# Patient Record
Sex: Male | Born: 1960 | Race: White | Hispanic: No | Marital: Married | Smoking: Never smoker
Health system: Southern US, Community
[De-identification: ages and names within clinical notes are randomized; demographics above are authoritative.]

## PROBLEM LIST (undated history)

## (undated) DIAGNOSIS — H9313 Tinnitus, bilateral: Secondary | ICD-10-CM

## (undated) DIAGNOSIS — F419 Anxiety disorder, unspecified: Secondary | ICD-10-CM

## (undated) DIAGNOSIS — M6530 Trigger finger, unspecified finger: Secondary | ICD-10-CM

## (undated) DIAGNOSIS — K219 Gastro-esophageal reflux disease without esophagitis: Secondary | ICD-10-CM

## (undated) HISTORY — DX: Trigger finger, unspecified finger: M65.30

## (undated) HISTORY — DX: Anxiety disorder, unspecified: F41.9

## (undated) HISTORY — DX: Tinnitus, bilateral: H93.13

## (undated) HISTORY — DX: Gastro-esophageal reflux disease without esophagitis: K21.9

---

## 1996-07-09 DIAGNOSIS — M5030 Other cervical disc degeneration, unspecified cervical region: Secondary | ICD-10-CM | POA: Insufficient documentation

## 1999-06-01 ENCOUNTER — Emergency Department (HOSPITAL_COMMUNITY): Admission: EM | Admit: 1999-06-01 | Discharge: 1999-06-01 | Payer: Self-pay

## 1999-06-21 ENCOUNTER — Other Ambulatory Visit: Admission: RE | Admit: 1999-06-21 | Discharge: 1999-06-28 | Payer: Self-pay

## 2003-02-11 ENCOUNTER — Ambulatory Visit (HOSPITAL_COMMUNITY): Admission: RE | Admit: 2003-02-11 | Discharge: 2003-02-11 | Payer: Self-pay | Admitting: Urology

## 2003-02-11 ENCOUNTER — Ambulatory Visit (HOSPITAL_BASED_OUTPATIENT_CLINIC_OR_DEPARTMENT_OTHER): Admission: RE | Admit: 2003-02-11 | Discharge: 2003-02-11 | Payer: Self-pay | Admitting: Urology

## 2003-02-11 ENCOUNTER — Encounter (INDEPENDENT_AMBULATORY_CARE_PROVIDER_SITE_OTHER): Payer: Self-pay | Admitting: Specialist

## 2005-01-18 ENCOUNTER — Ambulatory Visit (HOSPITAL_COMMUNITY): Admission: RE | Admit: 2005-01-18 | Discharge: 2005-01-18 | Payer: Self-pay | Admitting: Internal Medicine

## 2005-02-12 ENCOUNTER — Encounter: Admission: RE | Admit: 2005-02-12 | Discharge: 2005-03-14 | Payer: Self-pay | Admitting: Neurology

## 2006-10-03 ENCOUNTER — Ambulatory Visit: Payer: Self-pay | Admitting: Cardiology

## 2006-10-03 ENCOUNTER — Inpatient Hospital Stay (HOSPITAL_COMMUNITY): Admission: EM | Admit: 2006-10-03 | Discharge: 2006-10-06 | Payer: Self-pay | Admitting: Emergency Medicine

## 2006-10-06 ENCOUNTER — Encounter (INDEPENDENT_AMBULATORY_CARE_PROVIDER_SITE_OTHER): Payer: Self-pay | Admitting: Internal Medicine

## 2006-11-28 ENCOUNTER — Ambulatory Visit: Payer: Self-pay | Admitting: Cardiology

## 2010-08-14 NOTE — Assessment & Plan Note (Signed)
[facility]                            CARDIOLOGY OFFICE NOTE   NAME:Tim Manning, Tim Manning                   MRN:          [id number]  DATE:11/28/2006                            DOB:          21-Jul-1960    PRIMARY CARE PHYSICIAN:  Jonita Albee, M.D.   REASON FOR PRESENTATION:  Patient with chest pain.   HISTORY OF PRESENT ILLNESS:  The patient is a pleasant 50 year old  gentleman without prior cardiac history.  He was hospitalized with  atypical chest pain on July 3.  There were some worsened features.  We  did perform a cardiac CT on him that demonstrated no coronary calcium  and no evidence of coronary obstruction.  He had a mildly reduced  ejection fraction, but an echocardiogram follow-up suggested the EF to  be normal.  He had had a history of mitral valve prolapse questionably  in the past, but this was not seen on the echocardiogram.  There was  tiny pulmonary nodule.  However, the radiology reading suggests this to  be benign with his history of not smoking.   Since being seen, the patient has had one episode of chest discomfort.  He has not been having any neck or arm discomfort.  He has not been  having any neck or arm discomfort.  He has not been having any  palpitations or presyncope or syncope.  He denies any PND or orthopnea.   PAST MEDICAL HISTORY:  1. General chest pain.  2. Dyslipidemia with a low HDL.  3. Cervical bulging disks following an accident.  4. Tonsillectomy and adenoidectomy.   ALLERGIES:  CODEINE.   MEDICATIONS:  None.   REVIEW OF SYSTEMS:  As stated in the HPI, otherwise negative for other  systems.   PHYSICAL EXAMINATION:  The patient is in no distress.  Blood pressure 117/79, heart rate 82 and regular.  HEENT:  Eyelids unremarkable.  Pupils equal, round, and react to light.  Fundi not visualized.  Oral mucosa unremarkable.  NECK:  No jugular venous distention.  Wave form within normal limits.  Carotid  upstroke brisk and symmetrical.  No bruits, thyromegaly.  LYMPHATICS:  No cervical, axillary, inguinal adenopathy.  LUNGS:  Clear to auscultation bilaterally.  BACK:  No costovertebral angle tenderness.  CHEST:  Unremarkable.  HEART:  PMI not displaced or sustained.  S1/s2 within normal limits.  No  S3, no S4, no clicks, no rubs, no murmurs.  ABDOMEN:  Flat.  Positive bowel sounds.  Normal in frequency, pitch, no  bruits, no rebound, no guarding, no midline pulsatile mass, no  organomegaly.  SKIN:  No rashes, no nodules.  EXTREMITIES:  2+ pulses, no edema.   EKG:  Sinus rhythm.  Rate 82, RSR prime V1 and V2.  No acute ST/T-wave  changes.   ASSESSMENT/PLAN:  1. Chest discomfort.  The patient's chest discomfort does not have an      anginal etiology.  No further cardiovascular testing is suggested.      We have discussed primary risk reduction.  2. Dyslipidemia.  I have suggested walking to try to increase his HDL.  3. Small pulmonary nodule.  The patient had this incidentally noted on      the CT.  I have discussed it with the patient.  I have suggested a      follow-up chest x-ray per his primary caregiver in one year.  He      will follow up with this.     Rollene Rotunda, MD, [facility]  Electronically Signed    JH/MedQ  DD: 11/28/2006  DT: 11/29/2006  Job #: [id number]   cc:   Jonita Albee, M.D.

## 2010-08-14 NOTE — Letter (Signed)
November 28, 2006    Jonita Albee, M.D.  [facility]  [address]  [place], [place] [address]   RE:  Manning, Tim  MRN:  [id number]  /  DOB:  11/11/1960   Dear Thayer Ohm,   Recently I had the pleasure of meeting Mr. Welty in the hospital.  Chest discomfort.  He was admitted initially by the hospitalist, who had  a higher index of suspicion that he might have coronary disease;  however, I thought his symptoms were somewhat more atypical.  We  eventually sent him for a cardiac CT, which demonstrated no coronary  disease and a negative calcium score.  There was a small subpleural  nodule on the posterior right lung apex.  This is mentioned in the CT  report, as they often do.  It is not suggested what the followup of this  should be.  Dr. Fredirick Lathe did comment that the patient was a nonsmoker.  However, she did not exclude the possibility that he needed a follow-up  CT in one year.  I have mentioned this to the patient.   I will not be seeing him back in this clinic, as he does not need  further cardiovascular followup.  I wonder if I can turn this matter  over to you, as you are his primary care physician.  Thank you for your  attention to this matter.  If you would like me to handle this in any  different way, please do not hesitate to give me a call at [phone], my  cell phone.  As always, thank you for allowing [place] to participate in the  care of your patients.    Sincerely,      Rollene Rotunda, MD, [facility]  Electronically Signed    JH/MedQ  DD: 11/28/2006  DT: 11/30/2006  Job #: [id number]

## 2010-08-14 NOTE — Discharge Summary (Signed)
NAME:  Tim Manning, Tim Manning            ACCOUNT NO.:  [id number]   MEDICAL RECORD NO.:  [id number]          PATIENT TYPE:  INP   LOCATION:  3733                         FACILITY:  [facility]   PHYSICIAN:  Altha Harm, MDDATE OF BIRTH:  May 11, 1960   DATE OF ADMISSION:  10/02/2006  DATE OF DISCHARGE:  10/06/2006                               DISCHARGE SUMMARY   DISCHARGE DISPOSITION:  Home.   FINAL DISCHARGE DIAGNOSES:  1. Chest pain, noncardiac.  2. History of ruptured disc at C5 and C6.  3. Chronic back pain.   DISCHARGE MEDICATIONS:  Naproxen 2-3 pills q.4-6 hours p.r.n.   CONSULTANTS:  Dr. Antoine Poche, cardiology.   PROCEDURES:  None.   DIAGNOSTIC STUDIES:  1. Portable chest x-ray, which shows mild cardiomegaly and mild      pulmonary vascular congestion.  2. Cardiac CT, which shows normal coronaries and normal left      ventricular anatomy and function with an ejection fraction of 57%.  3. Echocardiogram is normal.   PRIMARY CARE PHYSICIAN:  Dr. Perrin Maltese.   CHIEF COMPLAINT:  Chest pain.   HISTORY OF PRESENT ILLNESS:  Please see H&P for details of the HPI.   HOSPITAL COURSE:  Chest pain.  During the course of his hospitalization,  the patient continued to have recurrent chest pain, which was somewhat  relieved with sublingual nitroglycerin.  As the pain continued to recur,  the patient was started on a nitroglycerin drip and placed on a beta-  blocker.  The patient improved and the patient was able to be weaned off  of the nitroglycerin drip.  Cardiology was consulted and they opted for  a cardiac CT for the patient.  This could not be done on the weekend,  thus the patient was held over until Monday to have a cardiac CT done.  The cardiac CT showed findings as noted above and the patient is being  discharged home with a diagnosis of noncardiac chest pain.  Please note  that during his hospitalization, the patient states that the pain has  essentially subsided and  has not  reoccurred for the last 36 hours.  For followup, the patient  should followup with [facility] with an appointment to be  determined.  Dietary restrictions, the patient should be on a heart-  healthy diet.  Physical restrictions, none.  The patient should follow  up with his primary care physician on an as needed basis.      Altha Harm, MD  Electronically Signed     MAM/MEDQ  D:  10/06/2006  T:  10/06/2006  Job:  [id number]   cc:   Jonita Albee, M.D.  Rollene Rotunda, MD, [facility]

## 2010-08-14 NOTE — H&P (Signed)
NAME:  Tim Manning, Tim Manning            ACCOUNT NO.:  [id number]   MEDICAL RECORD NO.:  [id number]          PATIENT TYPE:  EMS   LOCATION:  [facility]JO                         FACILITY:  [facility]   PHYSICIAN:  Hind I Elsaid, MD      DATE OF BIRTH:  Jul 16, 1960   DATE OF ADMISSION:  10/02/2006  DATE OF DISCHARGE:                              HISTORY & PHYSICAL   PRIMARY CARE PHYSICIAN:  [facility].   CHIEF COMPLAINT:  Chest pain and, therefore, visit.   HISTORY OF PRESENT ILLNESS:  A 50 year old male with no previous  significant medical history, presented with retrosternal chest pain that  started around 3 p.m.  The patient was driving his car, then it radiated  to his jaw and his left arm.  Denies shortness of breath or nausea.  Condition associated with diaphoresis.  Denies any sweats.  Denies any  palpitations.  Patient had to visit his [facility] where he  received 1 nitroglycerin sublingual with relief of his pain and 325 mg  of aspirin.  Patient admitted some relief of his pain after taking the  nitroglycerin.  Pain is squeezing _____, around 4-5 in severity.  There  is no relieving or exacerbating factor.  At this time, the patient also  complained of pain, which is mild in severity.  Condition not associated  with fevers, shortness of breath, nausea, vomiting or abdominal pain.   PAST MEDICAL HISTORY:  1. Ruptured disc of C5 and C6.  2. Pain on his fingers.   MEDICATIONS:  Naproxen 2-3 pills q.4-6 hours.   ALLERGIES:  NO KNOWN DRUG ALLERGIES.   FAMILY HISTORY:  Father has a history of brain tumor, status post  surgery.  Mother has a history of arthritis.   SOCIAL HISTORY:  Worked a Environmental manager.  No smoking.  No IV drug user.  No alcohol.   PAST SURGICAL HISTORY:  Denies any significant past surgical history.   PHYSICAL EXAMINATION:  Patient seen in the emergency room.  VITAL SIGNS:  Temperature 97.6.  Blood pressure 119/77.  Pulse rate 75.  Respiratory rate 24.   Saturating 100% on room air.  GENERAL:  Patient lying comfortable in bed.  Not in respiratory distress  or shortness of breath.  HEENT:  Normocephalic, atraumatic.  Extraocular muscle movement was  normal.  NECK EXAMINATION:  No JVD.  No  lymphadenopathy.  No thyromegaly.  HEART EXAMINATION:  S1 and S2.  No added sound.  No gallop.  No murmur.  LUNG EXAMINATION:  Normal vesicular breathing with equal air entry.  ABDOMEN:  Mildly distended.  Bowel sounds positive.  No organomegaly.  EXTREMITIES:  No lower limb edema.  Peripheral pulses intact.  CNS:  Alert and oriented x3 with no focal neurological finding.  BACK EXAMINATION:  No decubitus ulcer and there is no point of  tenderness.   LABORATORY DATA:  Blood workup, cardiac enzymes x1 was negative.  White  blood cell 6.9, hemoglobin 14.8, hematocrit is 43.1.  Sodium 140,  potassium 4.1, chloride 106, BUN 16 and bicarb 26, glucose of 223.  Liver function tests within normal.  Chest x-ray, mild cardiomegaly.  Mild pulmonary vascular congestion.   ASSESSMENT/PLAN:  1. Chest pain.  The patient has a low risk factor for development of      coronary syndrome, but we cannot rule out unstable angina.  We will      admit the patient for telemetry.  We will monitor cardiac enzymes.      A 2D echo, D-dimer, aspirin and nitroglycerin.  We will consider      cardiology if needed or stress test as an outpatient.  2. Deep venous thrombosis and gastrointestinal prophylaxis.  3. For finger pain, we will hold Naproxen.  We will give the patient      Tylenol p.r.n.      Hind Bosie Helper, MD  Electronically Signed     HIE/MEDQ  D:  10/03/2006  T:  10/03/2006  Job:  [id number]

## 2010-08-14 NOTE — Consult Note (Signed)
NAME:  Tim Manning, Tim Manning            ACCOUNT NO.:  [id number]   MEDICAL RECORD NO.:  [id number]          PATIENT TYPE:  INP   LOCATION:  3733                         FACILITY:  [facility]   PHYSICIAN:  Rollene Rotunda, MD, FACCDATE OF BIRTH:  31-Jul-1960   DATE OF CONSULTATION:  10/03/2006  DATE OF DISCHARGE:                                 CONSULTATION   PRIMARY CARE PHYSICIAN:  Jonita Albee, M.D.   REASON FOR CONSULTATION:  Evaluate patient with chest pain.   HISTORY OF PRESENT ILLNESS:  The patient is a 50 year old white  gentleman with no prior cardiac history.  He was in his usual state of  health until Wednesday.  He said Wednesday afternoon he noticed some  chest discomfort.  This was a substernal tightness.  It was 2/10 in  intensity.  It kind of waxed and waned.  He does not think it occurred  all day and cannot really quantify how long it might have gone on.  However, it did not keep him awake or bother him later in the evening.  In fact, on Wednesday evening, he had no discomfort and was able to go  to a concert where he did quite a bit of walking and was not able to  bring on this discomfort.  On the next day, he again noticed this  discomfort.  At that time, he was working on his job as a Environmental manager,  Public affairs consultant of remodeled homes.  He was moving equipment in and  out of his car and active.  He did not notice the discomfort stopping  him from this activity.  However, when he had become quite, he would  notice discomfort.  He had little radiation to his jaw.  Again, it was 2  out of 10.  There was no associated nausea, vomiting, diaphoresis.  He  had not had this kind of discomfort before.  There was slight radiation  into his jaw.  He talked this over with a nurse acquaintance and was  told to go to the doctor.  He saw urgent care, and 911 was called to  take him to the hospital.  Of note, he did get nitroglycerin and noticed  that after three to five minutes the  discomfort went away.  He has had a  recurrence a couple of times since he has been here, and the discomfort  has gone away with nitroglycerin each time.  He is now on nitroglycerin  drip.   Of note, the patient is not overly active.  He does not exercise.  He  does his routine shores of daily living and is not able to bring on any  of this discomfort.  He has not had any recent substernal chest  discomfort prior to this.  He has had no new shortness of breath.  He  has had no PND or orthopnea.  He has had no palpitations, presyncope, or  syncope.  He has had no decrease in exercise tolerance.  He has been  taking Naprosyn for a trigger finger for the past several days.  His  initial enzymes were negative in  the emergency room.  His EKG  demonstrated no acute ST-T wave changes.  There were nonspecific T-wave  inversion in lead III.   PAST MEDICAL HISTORY:  The patient has no history of hypertension,  hyperlipidemia, or diabetes.  He has had no prior cardiac testing.  He  does have cervical bulging disks following an accident.   PAST SURGICAL HISTORY:  Tonsillectomy and adenoidectomy.   ALLERGIES:  NONE.   MEDICATION:  Naprosyn recently.   SOCIAL HISTORY:  The patient is married.  He has three children.  He has  never smoked cigarettes and does not drink alcohol.  He does not excise  routinely.  He is a Environmental manager.   FAMILY HISTORY:  Noncontributory for early coronary artery disease,  sudden cardiac death, or heart failure.  He is an only child.   REVIEW OF SYSTEMS:  As stated in the HPI and positive for trigger  finger.  Negative for all other systems.   PHYSICAL EXAMINATION:  GENERAL:  The patient is in no distress.  VITAL SIGNS:  Blood pressure 114/78, heart rate 79 and regular,  afebrile, 96% saturation on room air.  HEENT:  Eyelids unremarkable, pupils equal, round, and reactive to  light, fundi not visualized, oral mucosa unremarkable.  NECK:  No jugular distension 45  degrees, carotid upstroke brisk and  symmetrical, no bruits, no thyromegaly.  LYMPHATICS:  No cervical, axillary, or inguinal adenopathy.  LUNGS:  Clear to auscultation bilaterally.  BACK:  No costovertebral angle tenderness.  CHEST:  Unremarkable.  HEART:  PMI not displaced or sustained, S1 and S2 within normal limits,  no S3, positive S4, no clicks, no rubs, no murmurs.  ABDOMEN:  Flat, positive bowel sounds, normal in frequency and pitch, no  bruits, no rebound, no guarding, no midline pulsatile mass, no  hepatomegaly, no splenomegaly.  SKIN:  No rashes, no nodules.  EXTREMITIES:  There are 2+ pulses throughout, no edema, no cyanosis, no  clubbing.  NEUROLOGIC:  Oriented to person, place, and time, cranial nerves II-XII  grossly intact, motor grossly intact throughout.   LABORATORY DATA:  EKG sinus rhythm, right axis deviation, intervals  within normal limits, prominent R prime in V1, possible left atrial  enlargement, nonspecific inferior T-wave inversion in lead V3.   Chest x-ray:  Questionable mild cardiomegaly and mild pulmonary vascular  congestion.   Labs:  CK-MB and troponin negative x2, TSH 4.748, BNP less than 30, HDL  27, LDL 66, total cholesterol 130, sodium 138, potassium 4.2, BUN 14,  creatinine 0.9, WBC 6.9, hemoglobin 14.8, platelets 223.   ASSESSMENT/PLAN:  1. Chest, the patient's chest discomfort is somewhat atypical for      angina.  It did go away with nitroglycerin which is not by itself a      useful diagnostic test.  He has not had any objective evidence of      ischemia with no diagnostic EKG changes and normal cardiac enzymes.      He does not have significant cardiovascular risk factors and has a      normal physical exam.  His EKG is slightly abnormal with a      rightward axis.   At this time, I think the pretest probability of obstructive coronary  disease as the etiology of his pain is moderately low.  However, it has  been recurrent resting  discomfort.  Therefore, we need to exclude  obstructive coronary disease.  I think a cardiac CT would be an  excellent test  for this gentleman.  He will continue on the IV  nitroglycerin for now.  He can receive an aspirin.  He is currently on a  beta blocker.  He has been started on Lovenox.   With his abnormal EKG, I would check a D-dimer.  He has no risk factors  for pulmonary embolism.  However, if his D-dimer is elevated, I would  have a low threshold for dedicated spiral CT to rule out pulmonary  embolism.   1. Dyslipidemia.  The patient has a low HDL.  We discussed exercises      as a treatment for this.      Rollene Rotunda, MD, [facility]  Electronically Signed     JH/MEDQ  D:  10/03/2006  T:  10/03/2006  Job:  [id number]   cc:   Jonita Albee, M.D.

## 2010-08-17 NOTE — Op Note (Signed)
   NAME:  Tim Manning, Tim Manning                      ACCOUNT NO.:  [id number]   MEDICAL RECORD NO.:  [id number]                   PATIENT TYPE:  AMB   LOCATION:  [facility]                                 FACILITY[facility]   PHYSICIAN:  Boston Service, M.D.             DATE OF BIRTH:  06-10-1960   DATE OF PROCEDURE:  02/11/2003  DATE OF DISCHARGE:                                 OPERATIVE REPORT   PREOPERATIVE DIAGNOSIS:  Undesired fertility; unwilling, unable to have  vasectomy done in the office.   POSTOPERATIVE DIAGNOSIS:  Undesired fertility; unwilling, unable to have  vasectomy done in the office.   OPERATION/PROCEDURE:  Vasectomy.   SURGEON:  Boston Service, M.D.   ANESTHESIA:  MAC.   DESCRIPTION OF PROCEDURE:  The patient was prepped and draped in the supine  position after institution of adequate level of intravenous sedation.  Vas  was brought to the anterior aspect of the scrotal skin.  Skin was  infiltrated with 0.25% lidocaine without epinephrine.  Needle-tip cautery  used to dissect through the skin and dartos to the anterior surface of the  vas.  Surrounding subcutaneous attachments freed with the needle-tip  cautery.  Vas was grasped with the ring forceps and then divided.  The  proximal end of the vas was sewn back on itself with a 3-0 chromic stitch.  Distal end of the vas was cauterized by placing the needle-tip Bovie down  the lumen of the vas.  Small segment of the vas was sent to pathology.  Dartos was closed with a single chromic stitch.  Skin was closed with a  single chromic stitch.  Identical technique was used on the right and left  sides.  The patient was given an athletic supporter and ice bag and returned  to the recovery room in satisfactory condition.                                               Boston Service, M.D.    RH/MEDQ  D:  02/11/2003  T:  02/11/2003  Job:  [id number]

## 2011-01-15 LAB — CARDIAC PANEL(CRET KIN+CKTOT+MB+TROPI)
CK, MB: 1.3
CK, MB: 1.7
Relative Index: 0.9
Relative Index: 1.1
Total CK: 139
Troponin I: 0.01

## 2011-01-15 LAB — CBC
HCT: 43.1
MCHC: 34.4
MCHC: 34.6
Platelets: 218
Platelets: 223
RDW: 12.8
RDW: 12.9

## 2011-01-15 LAB — BASIC METABOLIC PANEL
BUN: 11
CO2: 29
CO2: 29
Calcium: 8.4
Calcium: 8.6
Creatinine, Ser: 0.9
Creatinine, Ser: 0.93
GFR calc Af Amer: >60
GFR calc non Af Amer: >60
Glucose, Bld: 113 — ABNORMAL HIGH
Glucose, Bld: 94
Sodium: 138

## 2011-01-15 LAB — POCT I-STAT CREATININE: Creatinine, Ser: 0.8

## 2011-01-15 LAB — DIFFERENTIAL
Basophils Absolute: 0
Basophils Relative: 0
Basophils Relative: 1
Eosinophils Absolute: 0.2
Eosinophils Relative: 3
Lymphocytes Relative: 25
Lymphocytes Relative: 37
Lymphs Abs: 1.7
Monocytes Absolute: 0.7
Monocytes Absolute: 0.8 — ABNORMAL HIGH
Monocytes Relative: 11
Neutro Abs: 4.3
Neutrophils Relative %: 62

## 2011-01-15 LAB — CK TOTAL AND CKMB (NOT AT ARMC)
CK, MB: 1
Total CK: 126

## 2011-01-15 LAB — POCT CARDIAC MARKERS
CKMB, poc: <1 — ABNORMAL LOW
CKMB, poc: <1 — ABNORMAL LOW
Myoglobin, poc: 40.7
Myoglobin, poc: 47.9
Myoglobin, poc: 63.2
Operator id: 270651
Operator id: 284251

## 2011-01-15 LAB — LIPID PANEL
Cholesterol: 105
HDL: 24 — ABNORMAL LOW
LDL Cholesterol: 66
Total CHOL/HDL Ratio: 4.3
Total CHOL/HDL Ratio: 4.4
Triglycerides: 114
VLDL: 23

## 2011-01-15 LAB — I-STAT 8, (EC8 V) (CONVERTED LAB)
BUN: 16
Chloride: 106
HCT: 45
Hemoglobin: 15.3
Operator id: 270651
Potassium: 4.1
Sodium: 140
pCO2, Ven: 38.9 — ABNORMAL LOW

## 2011-01-15 LAB — COMPREHENSIVE METABOLIC PANEL
Albumin: 4
Alkaline Phosphatase: 71
BUN: 16
Calcium: 9
Glucose, Bld: 90
Potassium: 4
Total Protein: 6.7

## 2011-01-15 LAB — HOMOCYSTEINE: Homocysteine: 7.5

## 2015-07-15 DIAGNOSIS — K219 Gastro-esophageal reflux disease without esophagitis: Secondary | ICD-10-CM | POA: Insufficient documentation

## 2016-04-10 DIAGNOSIS — M6530 Trigger finger, unspecified finger: Secondary | ICD-10-CM | POA: Insufficient documentation

## 2017-12-09 ENCOUNTER — Ambulatory Visit (INDEPENDENT_AMBULATORY_CARE_PROVIDER_SITE_OTHER): Payer: Self-pay

## 2017-12-09 ENCOUNTER — Encounter: Payer: Self-pay | Admitting: Family Medicine

## 2017-12-09 ENCOUNTER — Ambulatory Visit: Payer: Self-pay | Admitting: Family Medicine

## 2017-12-09 VITALS — BP 143/82 | HR 82 | Temp 98.2°F | Resp 16 | Ht 67.75 in | Wt 189.9 lb

## 2017-12-09 DIAGNOSIS — M898X1 Other specified disorders of bone, shoulder: Secondary | ICD-10-CM

## 2017-12-09 DIAGNOSIS — R221 Localized swelling, mass and lump, neck: Secondary | ICD-10-CM

## 2017-12-09 LAB — POCT CBC
Granulocyte percent: 67.6 %G (ref 37–80)
HCT, POC: 46 % (ref 43.5–53.7)
Hemoglobin: 15.5 g/dL (ref 14.1–18.1)
Lymph, poc: 1.5 (ref 0.6–3.4)
MCH, POC: 30 pg (ref 27–31.2)
MCHC: 33.6 g/dL (ref 31.8–35.4)
MCV: 89.1 fL (ref 80–97)
MID (cbc): 0.3 (ref 0–0.9)
MPV: 8.1 fL (ref 0–99.8)
POC Granulocyte: 3.7 (ref 2–6.9)
POC LYMPH PERCENT: 27.8 %L (ref 10–50)
POC MID %: 4.6 %M (ref 0–12)
Platelet Count, POC: 221 10*3/uL (ref 142–424)
RBC: 5.16 M/uL (ref 4.69–6.13)
RDW, POC: 12.8 %
WBC: 5.5 10*3/uL (ref 4.6–10.2)

## 2017-12-09 NOTE — Patient Instructions (Signed)
° ° ° °  If you have lab work done today you will be contacted with your lab results within the next 2 weeks.  If you have not heard from us then please contact us. The fastest way to get your results is to register for My Chart. ° ° °IF you received an x-ray today, you will receive an invoice from [facility]. Please contact [facility] at [phone] with questions or concerns regarding your invoice.  ° °IF you received labwork today, you will receive an invoice from [facility]. Please contact [facility] at [phone] with questions or concerns regarding your invoice.  ° °Our billing staff will not be able to assist you with questions regarding bills from these companies. ° °You will be contacted with the lab results as soon as they are available. The fastest way to get your results is to activate your My Chart account. Instructions are located on the last page of this paperwork. If you have not heard from us regarding the results in 2 weeks, please contact this office. °  ° ° ° °

## 2017-12-09 NOTE — Progress Notes (Signed)
9/10/201911:54 AM  Tim Manning 1960/09/05, 57 y.o. male [id number]  Chief Complaint  Patient presents with  . clavilcle pain    right side  . Neck Pain    HPI:   Patient is a 57 y.o. male with past medical history significant for GERD who presents today for lump on right clavicle/neck  Has noticed for past several weeks a lump on right clavicle, lower neck Tender to touch, slightly No other lumps or bumps Has chronic neck pain from herniated disc but now feeling tight anterior right neck Denies any injuries  No flowsheet data found.   Depression screen PHQ 2/9 12/09/2017  Decreased Interest 0  Down, Depressed, Hopeless 0  PHQ - 2 Score 0    No Known Allergies  Prior to Admission medications   Not on File    Past Medical History:  Diagnosis Date  . GERD (gastroesophageal reflux disease)     History reviewed. No pertinent surgical history.  Social History   Tobacco Use  . Smoking status: Never Smoker  . Smokeless tobacco: Never Used  Substance Use Topics  . Alcohol use: Not on file    Family History  Problem Relation Age of Onset  . Stroke Mother   . Cancer Father     Review of Systems  Constitutional: Negative for chills, diaphoresis, fever, malaise/fatigue and weight loss.  HENT: Negative.   Respiratory: Negative for cough, hemoptysis and shortness of breath.   Cardiovascular: Negative for chest pain, palpitations and leg swelling.  Gastrointestinal: Negative for abdominal pain, nausea and vomiting.     OBJECTIVE:  Blood pressure (!) 143/82, pulse 82, temperature 98.2 F (36.8 C), temperature source Oral, resp. rate 16, height 5' 7.75" (1.721 m), weight 189 lb 14.4 oz (86.1 kg), SpO2 97 %. Body mass index is 29.09 kg/m.   Physical Exam  Constitutional: He is oriented to person, place, and time. He appears well-developed and well-nourished.  HENT:  Head: Normocephalic and atraumatic.  Mouth/Throat: Oropharynx is clear and moist.    Eyes: Pupils are equal, round, and reactive to light. Conjunctivae and EOM are normal.  Neck: Neck supple. No muscular tenderness present.  Distal right anterior neck/distal supraclavicular soft, non-discrete fullness about 4 cm  Cardiovascular: Normal rate and regular rhythm. Exam reveals no gallop and no friction rub.  No murmur heard. Pulmonary/Chest: Effort normal and breath sounds normal. He has no wheezes. He has no rales.  Abdominal: Soft. Bowel sounds are normal. He exhibits no distension and no mass. There is no tenderness.  Musculoskeletal: He exhibits no edema.  Lymphadenopathy:    He has no cervical adenopathy.    He has no axillary adenopathy.       Right: No supraclavicular adenopathy present.       Left: No supraclavicular adenopathy present.  Neurological: He is alert and oriented to person, place, and time.  Skin: Skin is warm and dry.  Psychiatric: He has a normal mood and affect.  Nursing note and vitals reviewed.     Results for orders placed or performed in visit on 12/09/17 (from the past 24 hour(s))  POCT CBC     Status: None   Collection Time: 12/09/17 12:24 PM  Result Value Ref Range   WBC 5.5 4.6 - 10.2 K/uL   Lymph, poc 1.5 0.6 - 3.4   POC LYMPH PERCENT 27.8 10 - 50 %L   MID (cbc) 0.3 0 - 0.9   POC MID % 4.6 0 - 12 %M  POC Granulocyte 3.7 2 - 6.9   Granulocyte percent 67.6 37 - 80 %G   RBC 5.16 4.69 - 6.13 M/uL   Hemoglobin 15.5 14.1 - 18.1 g/dL   HCT, POC 07.6 80.8 - 53.7 %   MCV 89.1 80 - 97 fL   MCH, POC 30.0 27 - 31.2 pg   MCHC 33.6 31.8 - 35.4 g/dL   RDW, POC 81.1 %   Platelet Count, POC 221 142 - 424 K/uL   MPV 8.1 0 - 99.8 fL    Dg Clavicle Right  Result Date: 12/09/2017 CLINICAL DATA:  Pain of right clavicle EXAM: RIGHT CLAVICLE - 2+ VIEWS COMPARISON:  Chest x-ray 10/02/2006 FINDINGS: Degenerative changes in the [facility] joint with joint space narrowing and spurring. Glenohumeral joint is maintained. No acute bony abnormality. Specifically,  no fracture, subluxation, or dislocation. Soft tissues are intact. IMPRESSION: No evidence of clavicular fracture. Mild degenerative changes in the right AC joint. Electronically Signed   By: Charlett Nose M.D.   On: 12/09/2017 12:20     ASSESSMENT and PLAN  1. Neck mass Next steps pending results - US Soft Tissue Head/Neck; Future  2. Pain of right clavicle - DG Clavicle Right; Future - POCT CBC - Comprehensive metabolic panel  Return if symptoms worsen or fail to improve.    Myles Lipps, MD Primary Care at [facility] [address] [place], [place] [address] Ph.  [phone] Fax [phone]

## 2017-12-10 LAB — COMPREHENSIVE METABOLIC PANEL
ALT: 22 IU/L (ref 0–44)
AST: 15 IU/L (ref 0–40)
Albumin/Globulin Ratio: 2 (ref 1.2–2.2)
Albumin: 4.6 g/dL (ref 3.5–5.5)
Alkaline Phosphatase: 70 IU/L (ref 39–117)
BUN/Creatinine Ratio: 13 (ref 9–20)
BUN: 14 mg/dL (ref 6–24)
Bilirubin Total: 0.6 mg/dL (ref 0.0–1.2)
CO2: 25 mmol/L (ref 20–29)
Calcium: 9.2 mg/dL (ref 8.7–10.2)
Chloride: 100 mmol/L (ref 96–106)
Creatinine, Ser: 1.04 mg/dL (ref 0.76–1.27)
GFR calc Af Amer: 91 mL/min/{1.73_m2} (ref >59)
GFR calc non Af Amer: 79 mL/min/{1.73_m2} (ref >59)
Globulin, Total: 2.3 g/dL (ref 1.5–4.5)
Glucose: 90 mg/dL (ref 65–99)
Potassium: 4.1 mmol/L (ref 3.5–5.2)
Sodium: 141 mmol/L (ref 134–144)
Total Protein: 6.9 g/dL (ref 6.0–8.5)

## 2017-12-22 ENCOUNTER — Ambulatory Visit
Admission: RE | Admit: 2017-12-22 | Discharge: 2017-12-22 | Disposition: A | Discharge status: Home / Self Care | Payer: No Typology Code available for payment source | Source: Ambulatory Visit | Attending: Family Medicine | Admitting: Family Medicine

## 2017-12-22 DIAGNOSIS — R221 Localized swelling, mass and lump, neck: Secondary | ICD-10-CM

## 2018-06-16 ENCOUNTER — Ambulatory Visit (INDEPENDENT_AMBULATORY_CARE_PROVIDER_SITE_OTHER): Payer: Self-pay

## 2018-06-16 ENCOUNTER — Encounter: Payer: Self-pay | Admitting: Family Medicine

## 2018-06-16 ENCOUNTER — Other Ambulatory Visit: Payer: Self-pay

## 2018-06-16 ENCOUNTER — Ambulatory Visit: Payer: Self-pay | Admitting: Family Medicine

## 2018-06-16 VITALS — BP 132/72 | HR 88 | Temp 98.8°F | Resp 17 | Ht 68.0 in | Wt 197.0 lb

## 2018-06-16 DIAGNOSIS — R0989 Other specified symptoms and signs involving the circulatory and respiratory systems: Secondary | ICD-10-CM

## 2018-06-16 DIAGNOSIS — R05 Cough: Secondary | ICD-10-CM

## 2018-06-16 DIAGNOSIS — R059 Cough, unspecified: Secondary | ICD-10-CM

## 2018-06-16 DIAGNOSIS — R112 Nausea with vomiting, unspecified: Secondary | ICD-10-CM

## 2018-06-16 LAB — POCT CBC
Granulocyte percent: 71.3 %G (ref 37–80)
HEMATOCRIT: 42.5 % — AB (ref 29–41)
HEMOGLOBIN: 15.1 g/dL — AB (ref 11–14.6)
Lymph, poc: 0.9 (ref 0.6–3.4)
MCH: 30.7 pg (ref 27–31.2)
MCHC: 35.6 g/dL — AB (ref 31.8–35.4)
MCV: 83.1 fL (ref 76–111)
MID (cbc): 0.6 (ref 0–0.9)
MPV: 7.6 fL (ref 0–99.8)
POC GRANULOCYTE: 3.7 (ref 2–6.9)
POC LYMPH PERCENT: 17.6 %L (ref 10–50)
POC MID %: 11.1 % (ref 0–12)
Platelet Count, POC: 186 10*3/uL (ref 142–424)
RBC: 4.82 M/uL (ref 4.69–6.13)
RDW, POC: 13.3 %
WBC: 5.2 10*3/uL (ref 4.6–10.2)

## 2018-06-16 LAB — POC INFLUENZA A&B (BINAX/QUICKVUE)
Influenza A, POC: NEGATIVE
Influenza B, POC: NEGATIVE

## 2018-06-16 MED ORDER — AZITHROMYCIN 250 MG PO TABS: ORAL_TABLET | ORAL | 0 refills | Status: DC

## 2018-06-16 MED ORDER — HYDROCODONE-HOMATROPINE 5-1.5 MG/5ML PO SYRP: ORAL_SOLUTION | ORAL | 0 refills | Status: DC

## 2018-06-16 MED ORDER — ONDANSETRON 4 MG PO TBDP: 4.0000 mg | ORAL_TABLET | Freq: Three times a day (TID) | ORAL | 0 refills | Status: DC | PRN

## 2018-06-16 NOTE — Patient Instructions (Addendum)
Blood counts, flu test, and x-ray reassuring, but I do hear some congestion on the right lower lobe of your lungs.  I do recommend starting azithromycin for possible early pneumonia at this time.    Mucinex or Mucinex DM as needed over-the-counter for cough, I did write for a cough medicine to use at night.  Additionally Zofran can be taken up to every 8 hours as needed for nausea to help with drinking fluids.  I expect symptoms to be improving this week, not worsening.  If any worsening symptoms including worsening shortness of breath, higher fevers or other worsening symptoms return for recheck or emergency room.   If you have lab work done today you will be contacted with your lab results within the next 2 weeks.  If you have not heard from [place] then please contact us. The fastest way to get your results is to register for My Chart.   IF you received an x-ray today, you will receive an invoice from [facility]. Please contact [facility] at [phone] with questions or concerns regarding your invoice.   IF you received labwork today, you will receive an invoice from [place]. Please contact [facility] at [phone] with questions or concerns regarding your invoice.   Our billing staff will not be able to assist you with questions regarding bills from these companies.  You will be contacted with the lab results as soon as they are available. The fastest way to get your results is to activate your My Chart account. Instructions are located on the last page of this paperwork. If you have not heard from [place] regarding the results in 2 weeks, please contact this office.

## 2018-06-16 NOTE — Progress Notes (Signed)
Subjective:    Patient ID: Tim Manning, male    DOB: 01-13-1961, 58 y.o.   MRN: [id number]  HPI Tim Manning is a 58 y.o. male Presents today for: Chief Complaint  Patient presents with  . Cough  . Chest Pain  . URI   Cold symptoms 4 days ago. Increased nasal congestion next day. More cough. Increased congestion with lying down and worsened cough. Coughing fits. Short of breath during coughing fits only. Vomited once last night, none today. Min nausea.  No hx of lung problems. Legionella in late 20's.   Daughter with cold symptoms one week ago. No recent travel in past 2 weeks,  no known exposure to coronavirus infected person. tmax 99.7.  Yesterday. Drinking fluids, but less appetite today with nausea today.  Tx: none today, zyrtec, sudafed past few days. Robitussin cough syrup yesterday.  Tylenol - last dose yesterday.    There are no active problems to display for this patient.  Past Medical History:  Diagnosis Date  . GERD (gastroesophageal reflux disease)    No past surgical history on file. No Known Allergies Prior to Admission medications   Not on File   Social History   Socioeconomic History  . Marital status: Married    Spouse name: Not on file  . Number of children: Not on file  . Years of education: Not on file  . Highest education level: Not on file  Occupational History  . Not on file  Social Needs  . Financial resource strain: Not on file  . Food insecurity:    Worry: Not on file    Inability: Not on file  . Transportation needs:    Medical: Not on file    Non-medical: Not on file  Tobacco Use  . Smoking status: Never Smoker  . Smokeless tobacco: Never Used  Substance and Sexual Activity  . Alcohol use: Not on file  . Drug use: Never  . Sexual activity: Not on file  Lifestyle  . Physical activity:    Days per week: Not on file    Minutes per session: Not on file  . Stress: Not on file  Relationships  . Social connections:    Talks on phone: Not on file    Gets together: Not on file    Attends religious service: Not on file    Active member of club or organization: Not on file    Attends meetings of clubs or organizations: Not on file    Relationship status: Not on file  . Intimate partner violence:    Fear of current or ex partner: Not on file    Emotionally abused: Not on file    Physically abused: Not on file    Forced sexual activity: Not on file  Other Topics Concern  . Not on file  Social History Narrative  . Not on file    Review of Systems Per HPI.     Objective:   Physical Exam Vitals signs reviewed.  Constitutional:      Appearance: He is well-developed.  HENT:     Head: Normocephalic and atraumatic.     Right Ear: Tympanic membrane, ear canal and external ear normal.     Left Ear: Tympanic membrane, ear canal and external ear normal.     Nose: No rhinorrhea.     Mouth/Throat:     Pharynx: No oropharyngeal exudate or posterior oropharyngeal erythema.  Eyes:     Conjunctiva/sclera: Conjunctivae normal.  Pupils: Pupils are equal, round, and reactive to light.  Neck:     Musculoskeletal: Neck supple.  Cardiovascular:     Rate and Rhythm: Normal rate and regular rhythm.     Heart sounds: Normal heart sounds. No murmur.  Pulmonary:     Effort: No tachypnea, accessory muscle usage or respiratory distress.     Breath sounds: No stridor. Examination of the right-lower field reveals rhonchi. Rhonchi present. No wheezing or rales.  Abdominal:     Palpations: Abdomen is soft.     Tenderness: There is no abdominal tenderness.  Lymphadenopathy:     Cervical: No cervical adenopathy.  Skin:    General: Skin is warm and dry.     Findings: No rash.  Neurological:     Mental Status: He is alert and oriented to person, place, and time.  Psychiatric:        Behavior: Behavior normal.    Vitals:   06/16/18 1415  BP: 132/72  Pulse: 88  Resp: 17  Temp: 98.8 F (37.1 C)  TempSrc: Oral   SpO2: 98%  Weight: 197 lb (89.4 kg)  Height: 5\' 8"  (1.727 m)   Results for orders placed or performed in visit on 06/16/18  POCT CBC  Result Value Ref Range   WBC 5.2 4.6 - 10.2 K/uL   Lymph, poc 0.9 0.6 - 3.4   POC LYMPH PERCENT 17.6 10 - 50 %L   MID (cbc) 0.6 0 - 0.9   POC MID % 11.1 0 - 12 %M   POC Granulocyte 3.7 2 - 6.9   Granulocyte percent 71.3 37 - 80 %G   RBC 4.82 4.69 - 6.13 M/uL   Hemoglobin 15.1 (A) 11 - 14.6 g/dL   HCT, POC 02.1 (A) 29 - 41 %   MCV 83.1 76 - 111 fL   MCH, POC 30.7 27 - 31.2 pg   MCHC 35.6 (A) 31.8 - 35.4 g/dL   RDW, POC 11.5 %   Platelet Count, POC 186 142 - 424 K/uL   MPV 7.6 0 - 99.8 fL  POC Influenza A&B(BINAX/QUICKVUE)  Result Value Ref Range   Influenza A, POC Negative Negative   Influenza B, POC Negative Negative   Dg Chest 2 View  Result Date: 06/16/2018 CLINICAL DATA:  58 year old male with cough and right lower lobe rhonchi EXAM: CHEST - 2 VIEW COMPARISON:  CT 10/06/2006, plain film 10/02/2006 FINDINGS: Cardiomediastinal silhouette unchanged in size and contour. No evidence of central vascular congestion. No pneumothorax or pleural effusion. No confluent airspace disease. No displaced fracture.  Degenerative changes of the spine IMPRESSION: Negative for acute cardiopulmonary disease Electronically Signed   By: Gilmer Mor D.O.   On: 06/16/2018 15:35      Assessment & Plan:   Tim Manning is a 58 y.o. male Cough - Plan: DG Chest 2 View, POC Influenza A&B(BINAX/QUICKVUE), azithromycin (ZITHROMAX) 250 MG tablet, HYDROcodone-homatropine (HYCODAN) 5-1.5 MG/5ML syrup  Rhonchi at right lung base - Plan: DG Chest 2 View, POCT CBC, POC Influenza A&B(BINAX/QUICKVUE), azithromycin (ZITHROMAX) 250 MG tablet  Nausea and vomiting, intractability of vomiting not specified, unspecified vomiting type - Plan: ondansetron (ZOFRAN ODT) 4 MG disintegrating tablet  Flu testing reassuring, CBC and chest x-ray reassuring, although I do hear rhonchi  in right lower lobe, suspicious for early community-acquired pneumonia.  Started on azithromycin, Zofran if needed for nausea, fluids and symptomatic care discussed.  Hycodan cough syrup if needed at night.  ER/RTC precautions given if acute worsening  symptoms or respiratory distress.  Understanding expressed.  Did recommend he isolate himself during course of current illness, although no specific known risk factors for COVID-19.  Testing was deferred.  Meds ordered this encounter  Medications  . azithromycin (ZITHROMAX) 250 MG tablet    Sig: Take 2 pills by mouth on day 1, then 1 pill by mouth per day on days 2 through 5.    Dispense:  6 tablet    Refill:  0  . HYDROcodone-homatropine (HYCODAN) 5-1.5 MG/5ML syrup    Sig: 86m by mouth a bedtime as needed for cough.    Dispense:  120 mL    Refill:  0  . ondansetron (ZOFRAN ODT) 4 MG disintegrating tablet    Sig: Take 1 tablet (4 mg total) by mouth every 8 (eight) hours as needed for nausea or vomiting.    Dispense:  10 tablet    Refill:  0   Patient Instructions   Blood counts, flu test, and x-ray reassuring, but I do hear some congestion on the right lower lobe of your lungs.  I do recommend starting azithromycin for possible early pneumonia at this time.    Mucinex or Mucinex DM as needed over-the-counter for cough, I did write for a cough medicine to use at night.  Additionally Zofran can be taken up to every 8 hours as needed for nausea to help with drinking fluids.  I expect symptoms to be improving this week, not worsening.  If any worsening symptoms including worsening shortness of breath, higher fevers or other worsening symptoms return for recheck or emergency room.   If you have lab work done today you will be contacted with your lab results within the next 2 weeks.  If you have not heard from [place] then please contact us. The fastest way to get your results is to register for My Chart.   IF you received an x-ray today, you will  receive an invoice from [facility]. Please contact [facility] at [phone] with questions or concerns regarding your invoice.   IF you received labwork today, you will receive an invoice from [place]. Please contact [facility] at [phone] with questions or concerns regarding your invoice.   Our billing staff will not be able to assist you with questions regarding bills from these companies.  You will be contacted with the lab results as soon as they are available. The fastest way to get your results is to activate your My Chart account. Instructions are located on the last page of this paperwork. If you have not heard from [place] regarding the results in 2 weeks, please contact this office.      Signed,   Meredith Staggers, MD Primary Care at [facility].  06/20/18 2:45 PM

## 2018-06-24 ENCOUNTER — Other Ambulatory Visit: Payer: Self-pay | Admitting: Family Medicine

## 2018-06-24 DIAGNOSIS — R05 Cough: Secondary | ICD-10-CM

## 2018-06-24 DIAGNOSIS — R059 Cough, unspecified: Secondary | ICD-10-CM

## 2018-06-25 NOTE — Telephone Encounter (Signed)
Patient is requesting a refill of the following medications: Requested Prescriptions   Pending Prescriptions Disp Refills  . HYDROcodone-homatropine (HYCODAN) 5-1.5 MG/5ML syrup 120 mL 0    Sig: 11m by mouth a bedtime as needed for cough.    Date of patient request:     Last office visit:06/16/2018 Date of last refill: 06/16/2018 Last refill amount: 120 mL Follow up time period per chart: n/a

## 2018-09-23 ENCOUNTER — Ambulatory Visit: Payer: Self-pay | Admitting: Family Medicine

## 2018-09-23 ENCOUNTER — Encounter: Payer: Self-pay | Admitting: Family Medicine

## 2018-09-23 ENCOUNTER — Other Ambulatory Visit: Payer: Self-pay

## 2018-09-23 VITALS — BP 131/75 | HR 61 | Temp 97.4°F | Resp 14 | Wt 192.4 lb

## 2018-09-23 DIAGNOSIS — M79642 Pain in left hand: Secondary | ICD-10-CM

## 2018-09-23 DIAGNOSIS — M6530 Trigger finger, unspecified finger: Secondary | ICD-10-CM

## 2018-09-23 MED ORDER — MELOXICAM 7.5 MG PO TABS: 7.5000 mg | ORAL_TABLET | Freq: Every day | ORAL | 0 refills | Status: DC

## 2018-09-23 NOTE — Patient Instructions (Addendum)
  I do suspect you have trigger finger again of the left middle finger, but with recurrent symptoms may be worth meeting with hand specialist, and possible injection.  I will refer you back to the hand center.  For now you can try meloxicam once per day, over-the-counter Tylenol if needed but do not use other NSAIDs.  Okay to take Nexium while you are on meloxicam to help protect stomach.  Return to the clinic or go to the nearest emergency room if any of your symptoms worsen or new symptoms occur.   If you have lab work done today you will be contacted with your lab results within the next 2 weeks.  If you have not heard from [place] then please contact us. The fastest way to get your results is to register for My Chart.   IF you received an x-ray today, you will receive an invoice from [facility]. Please contact [facility] at [phone] with questions or concerns regarding your invoice.   IF you received labwork today, you will receive an invoice from [place]. Please contact [facility] at [phone] with questions or concerns regarding your invoice.   Our billing staff will not be able to assist you with questions regarding bills from these companies.  You will be contacted with the lab results as soon as they are available. The fastest way to get your results is to activate your My Chart account. Instructions are located on the last page of this paperwork. If you have not heard from [place] regarding the results in 2 weeks, please contact this office.
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
## 2018-09-29 ENCOUNTER — Encounter: Payer: Self-pay | Admitting: Family Medicine

## 2018-10-16 ENCOUNTER — Other Ambulatory Visit: Payer: Self-pay | Admitting: Family Medicine

## 2018-10-16 DIAGNOSIS — M6530 Trigger finger, unspecified finger: Secondary | ICD-10-CM

## 2018-10-16 DIAGNOSIS — M79642 Pain in left hand: Secondary | ICD-10-CM

## 2018-10-16 NOTE — Telephone Encounter (Signed)
Forwarding medication refill to provider for review. 

## 2019-07-02 IMAGING — US US SOFT TISSUE HEAD/NECK
1 series · 5 of 5 positions shown · non-contrast
Comparison: None.

CLINICAL DATA: 57-year-old male with a history of fullness in the
neck

EXAM:
ULTRASOUND OF HEAD/NECK SOFT TISSUES
TECHNIQUE: Ultrasound examination of the head and neck soft tissues was
performed in the area of clinical concern.

[Series 1: us soft tissue head/neck · 0.05mm/px · 5 of 5 slices shown]
[im 1/5]
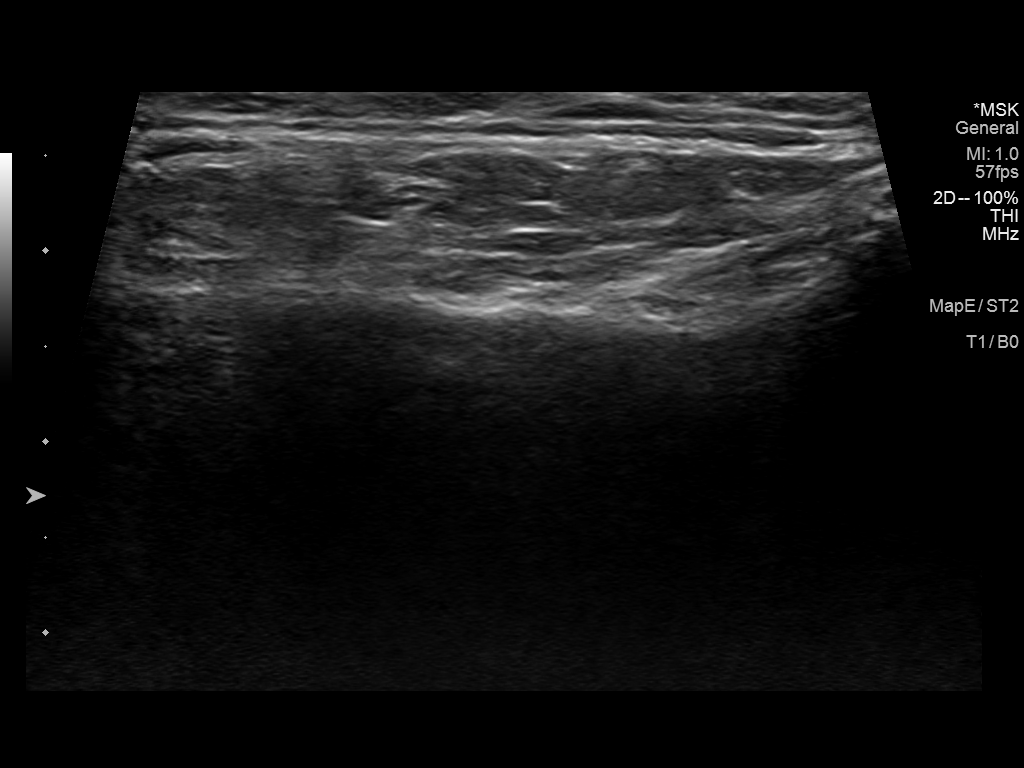
[im 2/5]
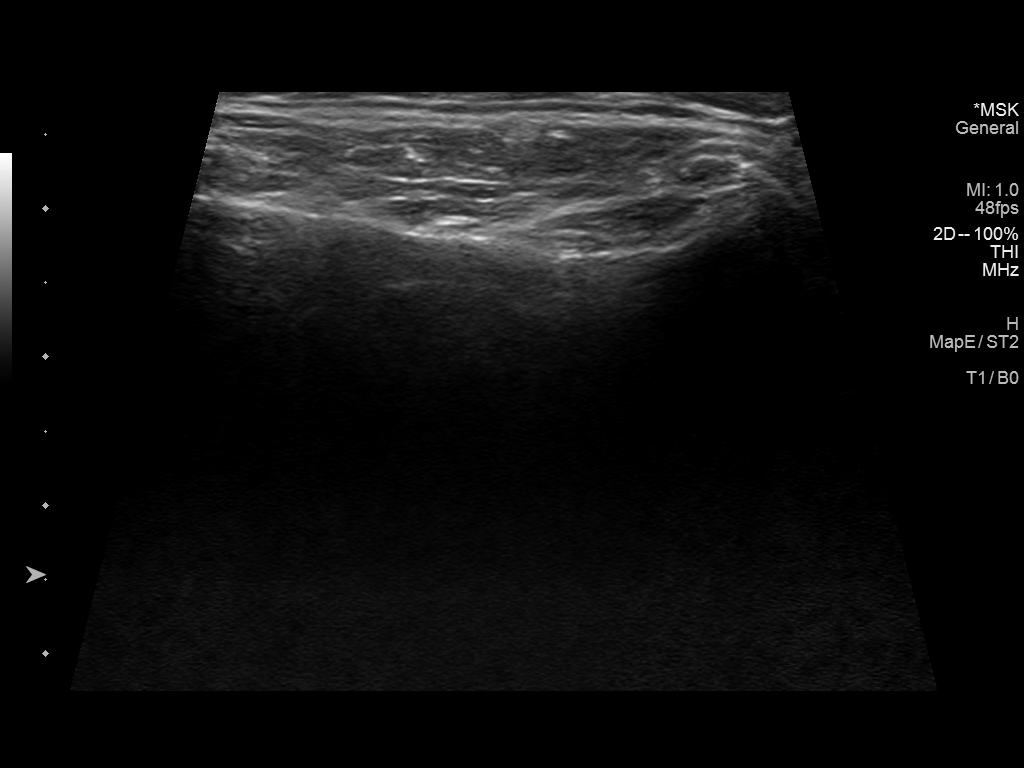
[im 3/5]
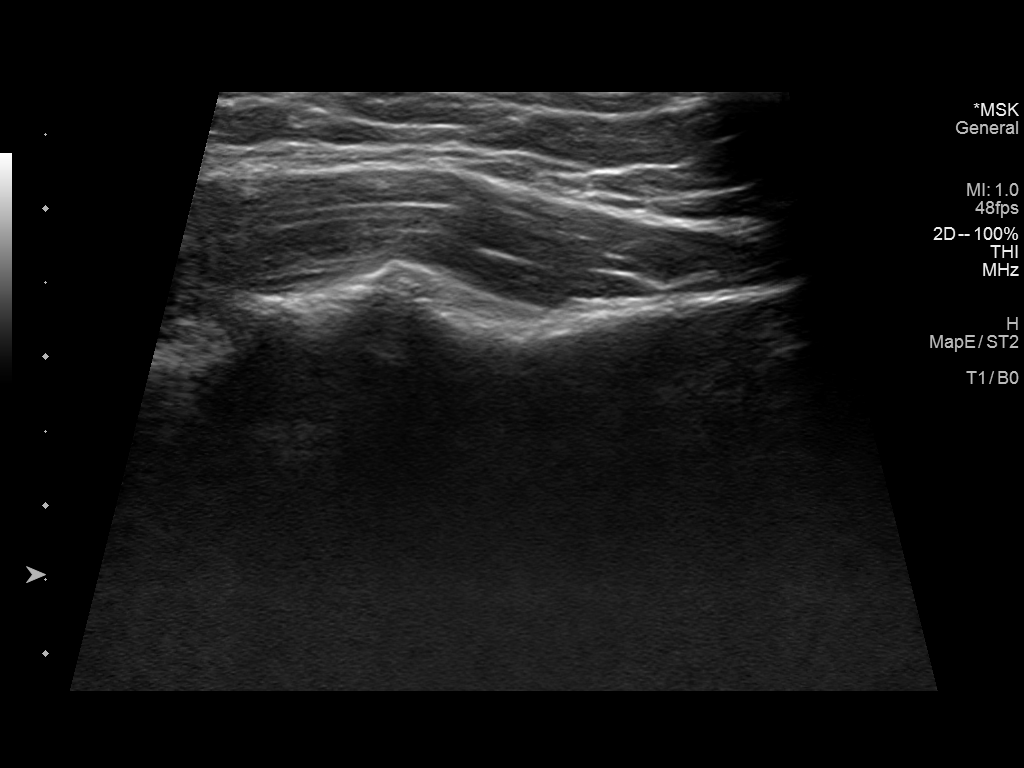
[im 4/5]
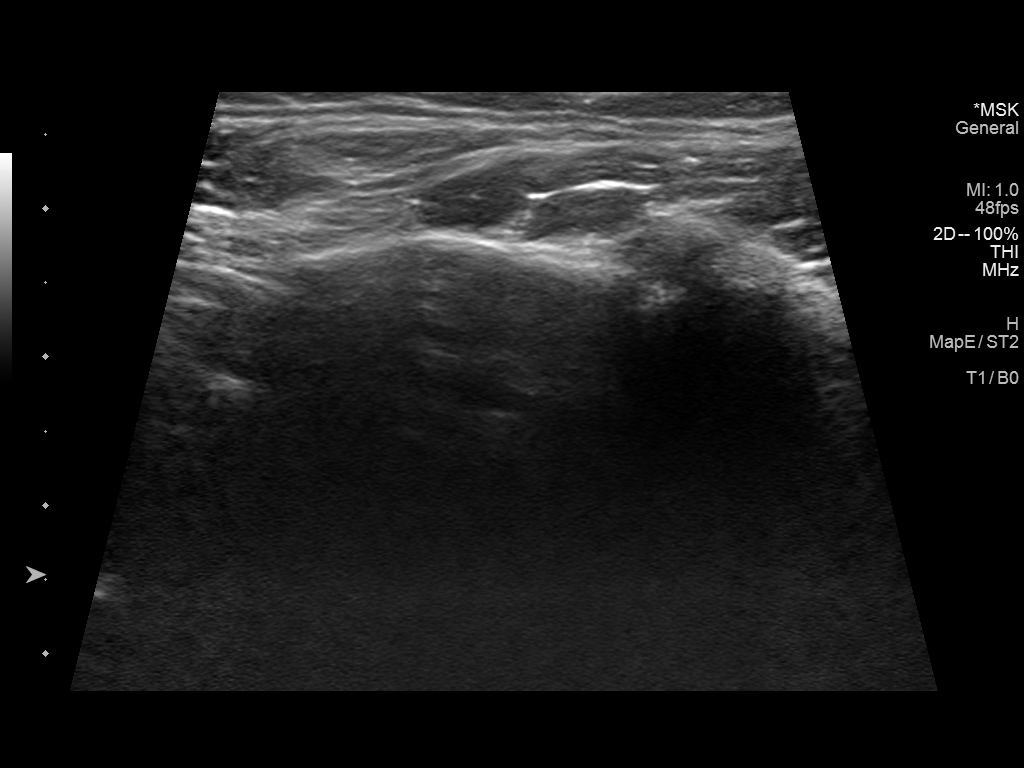
[im 5/5]
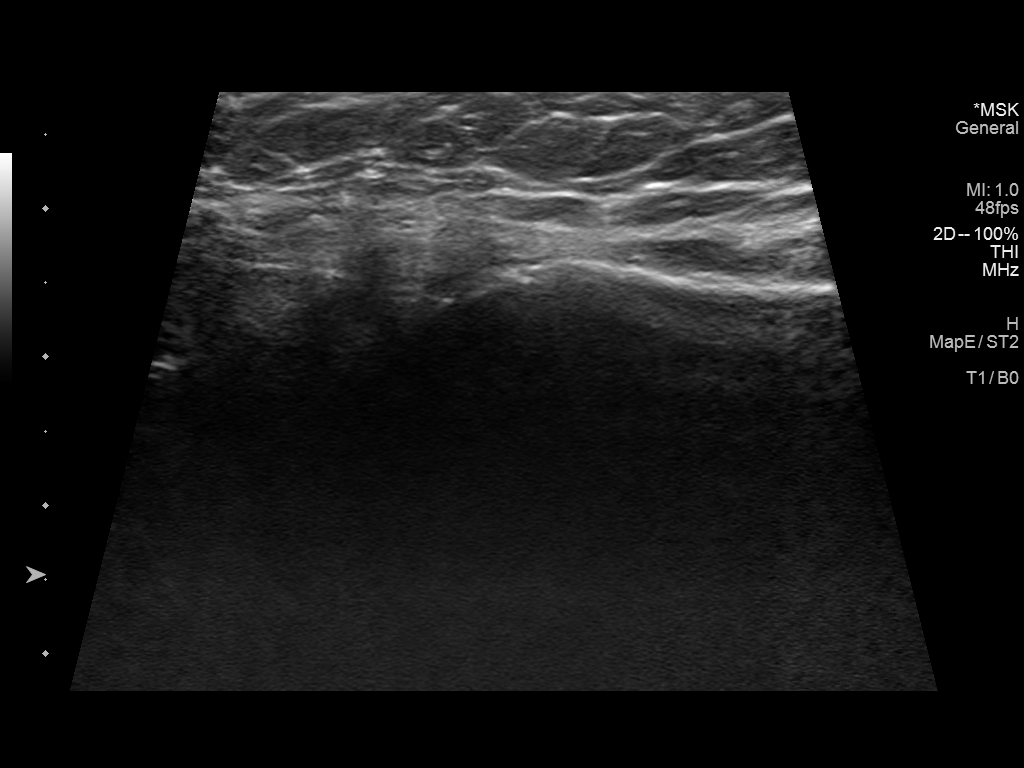

[5 of 5 positions shown; findings below may reference images not displayed]

FINDINGS: Directed duplex performed in the region clinical concern.

No focal fluid.  No soft tissue lesion.  No adenopathy.
IMPRESSION: Unremarkable sonographic survey in the region clinical concern.

## 2019-10-05 ENCOUNTER — Ambulatory Visit (HOSPITAL_COMMUNITY)
Admission: EM | Admit: 2019-10-05 | Discharge: 2019-10-05 | Disposition: A | Discharge status: Home / Self Care | Payer: No Typology Code available for payment source | Attending: Family Medicine | Admitting: Family Medicine

## 2019-10-05 ENCOUNTER — Other Ambulatory Visit: Payer: Self-pay

## 2019-10-05 ENCOUNTER — Ambulatory Visit (INDEPENDENT_AMBULATORY_CARE_PROVIDER_SITE_OTHER): Payer: No Typology Code available for payment source

## 2019-10-05 ENCOUNTER — Encounter (HOSPITAL_COMMUNITY): Payer: Self-pay

## 2019-10-05 DIAGNOSIS — M79642 Pain in left hand: Secondary | ICD-10-CM

## 2019-10-05 NOTE — Discharge Instructions (Signed)
Please use ice  Please try the splint  Please follow up in a day or two.

## 2019-10-05 NOTE — ED Notes (Signed)
Called ortho tech, Nadonna advised ETA approx 10 minutes for splint application.
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
## 2019-10-05 NOTE — Progress Notes (Signed)
Orthopedic Tech Progress Note Patient Details:  Tim Manning [birth year] [id number]  Ortho Devices Type of Ortho Device: Rad Gutter splint Ortho Device/Splint Location: Upper Left Extremity Ortho Device/Splint Interventions: Ordered, Application   Post Interventions Patient Tolerated: Well Instructions Provided: Adjustment of device, Care of device, Poper ambulation with device   Gerald Stabs 10/05/2019, 9:37 PM
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
## 2019-10-05 NOTE — ED Notes (Signed)
Ortho tech arrived 

## 2019-10-05 NOTE — ED Notes (Signed)
Called ortho tech and left message for status update of ETA; informed pt of waiting on ortho tech for placement.

## 2019-10-05 NOTE — ED Notes (Signed)
Ortho tech applied splint 

## 2019-10-07 ENCOUNTER — Encounter: Payer: Self-pay | Admitting: Family Medicine

## 2019-10-07 ENCOUNTER — Ambulatory Visit: Payer: Self-pay

## 2019-10-07 ENCOUNTER — Ambulatory Visit (INDEPENDENT_AMBULATORY_CARE_PROVIDER_SITE_OTHER): Payer: Self-pay | Admitting: Family Medicine

## 2019-10-07 ENCOUNTER — Other Ambulatory Visit: Payer: Self-pay

## 2019-10-07 VITALS — BP 151/89 | HR 76 | Ht 68.0 in | Wt 186.0 lb

## 2019-10-07 DIAGNOSIS — S63639A Sprain of interphalangeal joint of unspecified finger, initial encounter: Secondary | ICD-10-CM

## 2019-10-07 DIAGNOSIS — M79642 Pain in left hand: Secondary | ICD-10-CM
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
## 2019-10-07 NOTE — Patient Instructions (Signed)
Good to see you Please try the buddy tape  Please call to schedule the MRI   Please send me a message in MyChart with any questions or updates.  We will schedule a virtual visit once the MRI is resulted.   --Dr. Jordan Likes
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
## 2019-10-08 ENCOUNTER — Encounter: Payer: Self-pay | Admitting: Family Medicine

## 2019-10-11 ENCOUNTER — Other Ambulatory Visit: Payer: Self-pay | Admitting: Family Medicine

## 2019-10-11 MED ORDER — IBUPROFEN 600 MG PO TABS: 600.0000 mg | ORAL_TABLET | Freq: Three times a day (TID) | ORAL | 1 refills | Status: DC | PRN

## 2019-10-11 NOTE — Progress Notes (Signed)
Provided ibuprofen   Myra Rude, MD [facility] 10/11/2019, 8:31 AM

## 2019-11-01 ENCOUNTER — Encounter: Payer: Self-pay | Admitting: Family Medicine

## 2019-11-02 ENCOUNTER — Other Ambulatory Visit: Payer: Self-pay | Admitting: Family Medicine

## 2019-11-02 MED ORDER — DIAZEPAM 5 MG PO TABS: ORAL_TABLET | ORAL | 0 refills | Status: DC

## 2019-11-02 NOTE — Progress Notes (Signed)
Valium for mRI.   Myra Rude, MD [facility] 11/02/2019, 8:13 AM

## 2019-11-05 ENCOUNTER — Other Ambulatory Visit: Payer: No Typology Code available for payment source

## 2019-11-05 ENCOUNTER — Ambulatory Visit
Admission: RE | Admit: 2019-11-05 | Discharge: 2019-11-05 | Disposition: A | Discharge status: Home / Self Care | Payer: Self-pay | Source: Ambulatory Visit | Attending: Family Medicine | Admitting: Family Medicine

## 2019-11-05 DIAGNOSIS — S63639A Sprain of interphalangeal joint of unspecified finger, initial encounter: Secondary | ICD-10-CM

## 2019-11-05 MED ORDER — GADOBENATE DIMEGLUMINE 529 MG/ML IV SOLN
15.0000 mL | Freq: Once | INTRAVENOUS | Status: AC | PRN
  Administered 2019-11-05: 15 mL via INTRAVENOUS

## 2019-11-08 ENCOUNTER — Telehealth: Payer: Self-pay | Admitting: Family Medicine

## 2019-11-08 NOTE — Telephone Encounter (Signed)
Called pt to set up Virtual  OV for provider to review MRI results --left msg.  --glh

## 2019-11-11 ENCOUNTER — Telehealth (INDEPENDENT_AMBULATORY_CARE_PROVIDER_SITE_OTHER): Payer: Self-pay | Admitting: Family Medicine

## 2019-11-11 ENCOUNTER — Other Ambulatory Visit: Payer: Self-pay

## 2019-11-11 DIAGNOSIS — S63639D Sprain of interphalangeal joint of unspecified finger, subsequent encounter: Secondary | ICD-10-CM

## 2019-11-11 NOTE — Progress Notes (Signed)
Virtual Visit via Video Note  I connected with Tim Manning on 11/11/19 at  1:30 PM EDT by a video enabled telemedicine application and verified that I am speaking with the correct person using two identifiers.   I discussed the limitations of evaluation and management by telemedicine and the availability of in person appointments. The patient expressed understanding and agreed to proceed.  Patient: home  Physician: office   History of Present Illness:  Mr. Tim Manning is a 59 year old male that is following up after the MRI of his left hand:    Observations/Objective:  Gen: NAD, alert, cooperative with exam, well-appearing  Assessment and Plan:  Volar plate injury of finger: MRI was revealing for suspected tenosynovitis.  Still have the suspicion of a volar plate injury given the location of the effusion and mechanical symptoms on clinical exam. -Counseled on supportive care. -Could consider follow-up with his hand surgeon  Follow Up Instructions:    I discussed the assessment and treatment plan with the patient. The patient was provided an opportunity to ask questions and all were answered. The patient agreed with the plan and demonstrated an understanding of the instructions.   The patient was advised to call back or seek an in-person evaluation if the symptoms worsen or if the condition fails to improve as anticipated.   Clare Gandy, MD

## 2019-11-11 NOTE — Assessment & Plan Note (Signed)
MRI was revealing for suspected tenosynovitis.  Still have the suspicion of a volar plate injury given the location of the effusion and mechanical symptoms on clinical exam. -Counseled on supportive care. -Could consider follow-up with his hand surgeon

## 2019-11-15 ENCOUNTER — Encounter: Payer: Self-pay | Admitting: Family Medicine

## 2020-08-28 DIAGNOSIS — H9319 Tinnitus, unspecified ear: Secondary | ICD-10-CM | POA: Insufficient documentation

## 2021-06-25 ENCOUNTER — Encounter: Payer: Self-pay | Admitting: Physician Assistant

## 2021-06-25 ENCOUNTER — Ambulatory Visit: Payer: BC Managed Care – PPO | Admitting: Physician Assistant

## 2021-06-25 VITALS — BP 118/60 | HR 82 | Temp 99.2°F | Ht 68.0 in | Wt 173.2 lb

## 2021-06-25 DIAGNOSIS — M65321 Trigger finger, right index finger: Secondary | ICD-10-CM | POA: Diagnosis not present

## 2021-06-25 DIAGNOSIS — K219 Gastro-esophageal reflux disease without esophagitis: Secondary | ICD-10-CM | POA: Diagnosis not present

## 2021-06-25 DIAGNOSIS — M65342 Trigger finger, left ring finger: Secondary | ICD-10-CM

## 2021-06-25 DIAGNOSIS — M65351 Trigger finger, right little finger: Secondary | ICD-10-CM

## 2021-06-25 DIAGNOSIS — M65311 Trigger thumb, right thumb: Secondary | ICD-10-CM

## 2021-06-25 DIAGNOSIS — M65352 Trigger finger, left little finger: Secondary | ICD-10-CM

## 2021-06-25 DIAGNOSIS — M542 Cervicalgia: Secondary | ICD-10-CM

## 2021-06-25 DIAGNOSIS — F419 Anxiety disorder, unspecified: Secondary | ICD-10-CM

## 2021-06-25 DIAGNOSIS — M65312 Trigger thumb, left thumb: Secondary | ICD-10-CM

## 2021-06-25 DIAGNOSIS — H9313 Tinnitus, bilateral: Secondary | ICD-10-CM | POA: Diagnosis not present

## 2021-06-25 DIAGNOSIS — M65331 Trigger finger, right middle finger: Secondary | ICD-10-CM

## 2021-06-25 DIAGNOSIS — M65332 Trigger finger, left middle finger: Secondary | ICD-10-CM

## 2021-06-25 DIAGNOSIS — M65322 Trigger finger, left index finger: Secondary | ICD-10-CM

## 2021-06-25 DIAGNOSIS — M65341 Trigger finger, right ring finger: Secondary | ICD-10-CM

## 2021-06-25 MED ORDER — MELOXICAM 15 MG PO TABS: ORAL_TABLET | ORAL | 2 refills | Status: DC
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
## 2021-06-25 NOTE — Patient Instructions (Signed)
Good to meet you today! ?Please keep me updated on how you are doing. See you back for fasting labs and physical. ? ?Referral to ENT placed. ? ?XRAY at [facility] ?hiatal hernia. May need additional studies if persistent.  ? ?

## 2021-07-03 ENCOUNTER — Ambulatory Visit (INDEPENDENT_AMBULATORY_CARE_PROVIDER_SITE_OTHER)
Admission: RE | Admit: 2021-07-03 | Discharge: 2021-07-03 | Disposition: A | Discharge status: Home / Self Care | Payer: BC Managed Care – PPO | Source: Ambulatory Visit | Attending: Physician Assistant | Admitting: Physician Assistant

## 2021-07-03 DIAGNOSIS — K219 Gastro-esophageal reflux disease without esophagitis: Secondary | ICD-10-CM

## 2021-07-23 ENCOUNTER — Ambulatory Visit: Payer: BC Managed Care – PPO | Admitting: Family Medicine

## 2021-07-23 ENCOUNTER — Encounter: Payer: Self-pay | Admitting: Family Medicine

## 2021-07-23 ENCOUNTER — Telehealth: Payer: Self-pay | Admitting: Physician Assistant

## 2021-07-23 VITALS — BP 120/80 | HR 80 | Temp 98.7°F | Wt 184.1 lb

## 2021-07-23 DIAGNOSIS — B356 Tinea cruris: Secondary | ICD-10-CM

## 2021-07-23 MED ORDER — KETOCONAZOLE 2 % EX CREA: 1.0000 "application " | TOPICAL_CREAM | Freq: Three times a day (TID) | CUTANEOUS | 0 refills | Status: DC

## 2021-07-23 NOTE — Progress Notes (Signed)
? ?  Subjective:  ? ? Patient ID: Tim Manning, male    DOB: Jan 01, 1961, 61 y.o.   MRN: [id number] ? ?HPI ?Here to check an irritation in the groins and perineum that started a month ago. He tried an OTC antifungal cream which did not help at all. Then he tried a cortisone cream which helped a little.  ? ? ?Review of Systems  ?Constitutional: Negative.   ?Respiratory: Negative.    ?Cardiovascular: Negative.   ?Skin:  Positive for rash.  ? ?   ?Objective:  ? Physical Exam ?Constitutional:   ?   Appearance: Normal appearance.  ?Cardiovascular:  ?   Rate and Rhythm: Normal rate and regular rhythm.  ?   Pulses: Normal pulses.  ?   Heart sounds: Normal heart sounds.  ?Pulmonary:  ?   Effort: Pulmonary effort is normal.  ?   Breath sounds: Normal breath sounds.  ?Skin: ?   Comments: There is a macular erythematous rash on the perineum and in both groins   ?Neurological:  ?   Mental Status: He is alert.  ? ? ? ? ? ?   ?Assessment & Plan:  ?Tinea cruris, he will try Ketoconazole cream as needed.  ?Gershon Crane, MD ? ? ?

## 2021-08-22 NOTE — Telephone Encounter (Signed)
Disregard

## 2021-09-22 ENCOUNTER — Other Ambulatory Visit: Payer: Self-pay | Admitting: Physician Assistant

## 2021-10-24 ENCOUNTER — Ambulatory Visit (INDEPENDENT_AMBULATORY_CARE_PROVIDER_SITE_OTHER): Payer: BC Managed Care – PPO | Admitting: Physician Assistant

## 2021-10-24 ENCOUNTER — Encounter: Payer: Self-pay | Admitting: Physician Assistant

## 2021-10-24 VITALS — BP 134/82 | HR 80 | Temp 98.2°F | Ht 68.0 in | Wt 181.2 lb

## 2021-10-24 DIAGNOSIS — Z0000 Encounter for general adult medical examination without abnormal findings: Secondary | ICD-10-CM

## 2021-10-24 DIAGNOSIS — Z1211 Encounter for screening for malignant neoplasm of colon: Secondary | ICD-10-CM

## 2021-10-24 DIAGNOSIS — Z23 Encounter for immunization: Secondary | ICD-10-CM

## 2021-10-24 LAB — LIPID PANEL
Cholesterol: 153 mg/dL (ref 0–200)
HDL: 51.2 mg/dL (ref >39.00)
LDL Cholesterol: 86 mg/dL (ref 0–99)
NonHDL: 101.39
Total CHOL/HDL Ratio: 3
Triglycerides: 75 mg/dL (ref 0.0–149.0)
VLDL: 15 mg/dL (ref 0.0–40.0)

## 2021-10-24 LAB — COMPREHENSIVE METABOLIC PANEL
ALT: 24 U/L (ref 0–53)
AST: 20 U/L (ref 0–37)
Albumin: 4.4 g/dL (ref 3.5–5.2)
Alkaline Phosphatase: 63 U/L (ref 39–117)
BUN: 16 mg/dL (ref 6–23)
CO2: 29 mEq/L (ref 19–32)
Calcium: 9.4 mg/dL (ref 8.4–10.5)
Chloride: 104 mEq/L (ref 96–112)
Creatinine, Ser: 0.88 mg/dL (ref 0.40–1.50)
GFR: 92.33 mL/min (ref >60.00)
Glucose, Bld: 99 mg/dL (ref 70–99)
Potassium: 4.3 mEq/L (ref 3.5–5.1)
Sodium: 138 mEq/L (ref 135–145)
Total Bilirubin: 0.6 mg/dL (ref 0.2–1.2)
Total Protein: 7.3 g/dL (ref 6.0–8.3)

## 2021-10-24 LAB — CBC WITH DIFFERENTIAL/PLATELET
Basophils Absolute: 0 10*3/uL (ref 0.0–0.1)
Basophils Relative: 0.6 % (ref 0.0–3.0)
Eosinophils Absolute: 0.1 10*3/uL (ref 0.0–0.7)
Eosinophils Relative: 1.8 % (ref 0.0–5.0)
HCT: 44.2 % (ref 39.0–52.0)
Hemoglobin: 15.2 g/dL (ref 13.0–17.0)
Lymphocytes Relative: 30.8 % (ref 12.0–46.0)
Lymphs Abs: 1.5 10*3/uL (ref 0.7–4.0)
MCHC: 34.4 g/dL (ref 30.0–36.0)
MCV: 88.7 fl (ref 78.0–100.0)
Monocytes Absolute: 0.5 10*3/uL (ref 0.1–1.0)
Monocytes Relative: 10.9 % (ref 3.0–12.0)
Neutro Abs: 2.7 10*3/uL (ref 1.4–7.7)
Neutrophils Relative %: 55.9 % (ref 43.0–77.0)
Platelets: 197 10*3/uL (ref 150.0–400.0)
RBC: 4.99 Mil/uL (ref 4.22–5.81)
RDW: 13.5 % (ref 11.5–15.5)
WBC: 4.9 10*3/uL (ref 4.0–10.5)

## 2021-10-24 NOTE — Patient Instructions (Signed)
Great to see you! Fasting labs today Keep up good work

## 2021-10-24 NOTE — Progress Notes (Signed)
Subjective:    Patient ID: Tim Manning, male    DOB: Jan 20, 1961, 61 y.o.   MRN: [id number]  Chief Complaint  Patient presents with   Annual Exam    Pt here for annual CPE; pt is fasting for labs; pt thinks he may have had a yeast infection a few weeks ago and now have residual feeling there and slight discomfort    HPI Patient is in today for annual exam.  Acute concerns: Tinea cruris a few months ago, occasionally still has some discomfort, uses ketoconazole cream sometimes  Health maintenance: Lifestyle/ exercise: Walks Nutrition: Cooks at home with wife Mental health: Occasional anxiety  Sleep: No concerns  Substance use: None ETOH: Very rare Sexual activity: Monogamous with wife  Immunizations: UTD  Colonoscopy: Never had one Prostate symptoms: No concerns    Past Medical History:  Diagnosis Date   Anxiety Feb. 2023   GERD (gastroesophageal reflux disease)    Tinnitus of both ears    Trigger finger     History reviewed. No pertinent surgical history.  Family History  Problem Relation Age of Onset   Stroke Mother    Dementia Mother    Brain cancer Father        tumor near brainstem; not malignant   Dementia Father    Anal fissures Father     Social History   Tobacco Use   Smoking status: Never   Smokeless tobacco: Never  Substance Use Topics   Alcohol use: Yes    Alcohol/week: 2.0 standard drinks of alcohol    Types: 2 Cans of beer per week    Comment: Very occasional drinker, never more than one beer.   Drug use: Never     No Known Allergies  Review of Systems NEGATIVE UNLESS OTHERWISE INDICATED IN HPI      Objective:     BP 134/82 (BP Location: Right Arm)   Pulse 80   Temp 98.2 F (36.8 C) (Temporal)   Ht 5\' 8"  (1.727 m)   Wt 181 lb 3.2 oz (82.2 kg)   SpO2 98%   BMI 27.55 kg/m   Wt Readings from Last 3 Encounters:  10/24/21 181 lb 3.2 oz (82.2 kg)  07/23/21 184 lb 2 oz (83.5 kg)  06/25/21 173 lb 3.2 oz (78.6 kg)     BP Readings from Last 3 Encounters:  10/24/21 134/82  07/23/21 120/80  06/25/21 118/60     Physical Exam Vitals and nursing note reviewed.  Constitutional:      General: He is not in acute distress.    Appearance: Normal appearance. He is not toxic-appearing.  HENT:     Head: Normocephalic and atraumatic.     Right Ear: Tympanic membrane, ear canal and external ear normal.     Left Ear: Tympanic membrane, ear canal and external ear normal.     Nose: Nose normal.     Mouth/Throat:     Mouth: Mucous membranes are moist.     Pharynx: Oropharynx is clear.  Eyes:     Extraocular Movements: Extraocular movements intact.     Conjunctiva/sclera: Conjunctivae normal.     Pupils: Pupils are equal, round, and reactive to light.  Cardiovascular:     Rate and Rhythm: Normal rate and regular rhythm.     Pulses: Normal pulses.     Heart sounds: Normal heart sounds.  Pulmonary:     Effort: Pulmonary effort is normal.     Breath sounds: Normal breath sounds.  Abdominal:  General: Abdomen is flat. Bowel sounds are normal.     Palpations: Abdomen is soft.     Tenderness: There is no abdominal tenderness.  Musculoskeletal:        General: Normal range of motion.     Cervical back: Normal range of motion and neck supple.  Skin:    General: Skin is warm and dry.     Findings: No lesion or rash.  Neurological:     General: No focal deficit present.     Mental Status: He is alert and oriented to person, place, and time.  Psychiatric:        Mood and Affect: Mood normal.        Behavior: Behavior normal.        Assessment & Plan:   Problem List Items Addressed This Visit   None Visit Diagnoses     Encounter for annual physical exam    -  Primary   Relevant Orders   CBC with Differential/Platelet   Comprehensive metabolic panel   Lipid panel   Screening for colon cancer       Relevant Orders   Cologuard   Need for prophylactic vaccination with combined  diphtheria-tetanus-pertussis (DTP) vaccine       Relevant Orders   Tdap vaccine greater than or equal to 7yo IM (Completed)   Need for prophylactic vaccination and inoculation against varicella       Relevant Orders   Varicella-zoster vaccine IM (Completed)       Plan: Age-appropriate screening and counseling performed today. Will check labs and call with results. Preventive measures discussed and printed in AVS for patient.  -Tdap and Varicella updated today -Cologuard screening sent today -Vision / dental -Keep working on lifestyle  Patient Counseling: [x]   Nutrition: Stressed importance of moderation in sodium/caffeine intake, saturated fat and cholesterol, caloric balance, sufficient intake of fresh fruits, vegetables, and fiber.  [x]   Stressed the importance of regular exercise.   []   Substance Abuse: Discussed cessation/primary prevention of tobacco, alcohol, or other drug use; driving or other dangerous activities under the influence; availability of treatment for abuse.   [x]   Injury prevention: Discussed safety belts, safety helmets, smoke detector, smoking near bedding or upholstery.   []   Sexuality: Discussed sexually transmitted diseases, partner selection, use of condoms, avoidance of unintended pregnancy  and contraceptive alternatives.   [x]   Dental health: Discussed importance of regular tooth brushing, flossing, and dental visits.  [x]   Health maintenance and immunizations reviewed. Please refer to Health maintenance section.      Return in about 1 year (around 10/25/2022) for fasting labs and CPE .   Kenzly Rogoff M Eamon Tantillo, PA-C

## 2021-11-02 LAB — COLOGUARD: Cologuard: NEGATIVE

## 2021-11-13 LAB — COLOGUARD: COLOGUARD: NEGATIVE

## 2021-12-24 ENCOUNTER — Encounter: Payer: Self-pay | Admitting: *Deleted

## 2022-02-02 ENCOUNTER — Emergency Department (HOSPITAL_COMMUNITY)
Admission: EM | Admit: 2022-02-02 | Discharge: 2022-02-02 | Disposition: A | Discharge status: Home / Self Care | Payer: BC Managed Care – PPO | Attending: Emergency Medicine | Admitting: Emergency Medicine

## 2022-02-02 ENCOUNTER — Encounter (HOSPITAL_COMMUNITY): Payer: Self-pay | Admitting: *Deleted

## 2022-02-02 ENCOUNTER — Other Ambulatory Visit: Payer: Self-pay

## 2022-02-02 ENCOUNTER — Emergency Department (HOSPITAL_COMMUNITY): Payer: BC Managed Care – PPO

## 2022-02-02 DIAGNOSIS — M542 Cervicalgia: Secondary | ICD-10-CM | POA: Diagnosis present

## 2022-02-02 DIAGNOSIS — Y92410 Unspecified street and highway as the place of occurrence of the external cause: Secondary | ICD-10-CM | POA: Diagnosis not present

## 2022-02-02 DIAGNOSIS — R519 Headache, unspecified: Secondary | ICD-10-CM | POA: Diagnosis not present
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
## 2022-02-02 NOTE — Discharge Instructions (Signed)
The work-up today was overall reassuring.  Imaging studies performed showed no acute abnormalities.  Recommend taking Tylenol/Motrin as needed for pain at home.  Expect your pain to get worse over the next day or 2 before it is better.  Please do not hesitate to return to emergency department for worrisome signs and symptoms we discussed become apparent.

## 2022-02-02 NOTE — ED Notes (Signed)
EDPA into see, at BS.   
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
## 2022-02-06 ENCOUNTER — Encounter: Payer: Self-pay | Admitting: Physician Assistant

## 2022-02-06 ENCOUNTER — Ambulatory Visit (INDEPENDENT_AMBULATORY_CARE_PROVIDER_SITE_OTHER): Payer: BC Managed Care – PPO | Admitting: Physician Assistant

## 2022-02-06 DIAGNOSIS — Z23 Encounter for immunization: Secondary | ICD-10-CM | POA: Diagnosis not present

## 2022-02-06 DIAGNOSIS — H9313 Tinnitus, bilateral: Secondary | ICD-10-CM

## 2022-02-06 MED ORDER — TIZANIDINE HCL 4 MG PO TABS: 4.0000 mg | ORAL_TABLET | Freq: Four times a day (QID) | ORAL | 0 refills | Status: DC | PRN

## 2022-02-06 MED ORDER — NAPROXEN SODIUM 550 MG PO TABS: 550.0000 mg | ORAL_TABLET | Freq: Two times a day (BID) | ORAL | 0 refills | Status: AC

## 2022-02-06 NOTE — Patient Instructions (Addendum)
Please ask about getting an appointment with out PT specialists this week.  Call [facility] for an appt at [phone]
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
## 2022-02-07 ENCOUNTER — Ambulatory Visit (INDEPENDENT_AMBULATORY_CARE_PROVIDER_SITE_OTHER): Payer: BC Managed Care – PPO | Admitting: Physical Therapy

## 2022-02-07 DIAGNOSIS — M542 Cervicalgia: Secondary | ICD-10-CM

## 2022-02-07 DIAGNOSIS — M62838 Other muscle spasm: Secondary | ICD-10-CM | POA: Diagnosis not present
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
## 2022-02-11 ENCOUNTER — Encounter: Payer: Self-pay | Admitting: Physical Therapy

## 2022-02-13 ENCOUNTER — Encounter: Payer: Self-pay | Admitting: Physician Assistant

## 2022-02-15 ENCOUNTER — Encounter: Payer: Self-pay | Admitting: Physical Therapy

## 2022-02-15 ENCOUNTER — Ambulatory Visit (INDEPENDENT_AMBULATORY_CARE_PROVIDER_SITE_OTHER): Payer: BC Managed Care – PPO | Admitting: Physical Therapy

## 2022-02-15 ENCOUNTER — Encounter: Payer: Self-pay | Admitting: Physician Assistant

## 2022-02-15 ENCOUNTER — Ambulatory Visit (INDEPENDENT_AMBULATORY_CARE_PROVIDER_SITE_OTHER): Payer: BC Managed Care – PPO | Admitting: Physician Assistant

## 2022-02-15 VITALS — BP 138/82 | HR 80 | Temp 97.4°F | Ht 67.0 in | Wt 188.6 lb

## 2022-02-15 DIAGNOSIS — M62838 Other muscle spasm: Secondary | ICD-10-CM | POA: Diagnosis not present

## 2022-02-15 DIAGNOSIS — M542 Cervicalgia: Secondary | ICD-10-CM | POA: Diagnosis not present
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
## 2022-02-19 ENCOUNTER — Encounter: Payer: Self-pay | Admitting: Physical Therapy

## 2022-02-19 ENCOUNTER — Ambulatory Visit: Payer: BC Managed Care – PPO | Admitting: Physical Therapy

## 2022-02-19 DIAGNOSIS — M542 Cervicalgia: Secondary | ICD-10-CM

## 2022-02-19 DIAGNOSIS — M62838 Other muscle spasm: Secondary | ICD-10-CM
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
## 2022-02-20 ENCOUNTER — Other Ambulatory Visit: Payer: Self-pay | Admitting: Physician Assistant

## 2022-02-20 DIAGNOSIS — F419 Anxiety disorder, unspecified: Secondary | ICD-10-CM

## 2022-02-20 DIAGNOSIS — F4310 Post-traumatic stress disorder, unspecified: Secondary | ICD-10-CM

## 2022-02-25 ENCOUNTER — Encounter: Payer: Self-pay | Admitting: Physical Therapy

## 2022-02-25 ENCOUNTER — Ambulatory Visit: Payer: BC Managed Care – PPO | Admitting: Physical Therapy

## 2022-02-25 DIAGNOSIS — M542 Cervicalgia: Secondary | ICD-10-CM | POA: Diagnosis not present

## 2022-02-25 DIAGNOSIS — M62838 Other muscle spasm: Secondary | ICD-10-CM | POA: Diagnosis not present
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
## 2022-02-28 ENCOUNTER — Encounter: Payer: Self-pay | Admitting: Physical Therapy

## 2022-02-28 ENCOUNTER — Ambulatory Visit (INDEPENDENT_AMBULATORY_CARE_PROVIDER_SITE_OTHER): Payer: BC Managed Care – PPO

## 2022-02-28 ENCOUNTER — Ambulatory Visit: Payer: BC Managed Care – PPO | Admitting: Physical Therapy

## 2022-02-28 ENCOUNTER — Ambulatory Visit: Payer: BC Managed Care – PPO | Admitting: Family Medicine

## 2022-02-28 VITALS — BP 156/82 | HR 85 | Ht 67.0 in | Wt 184.0 lb

## 2022-02-28 DIAGNOSIS — M65331 Trigger finger, right middle finger: Secondary | ICD-10-CM | POA: Diagnosis not present

## 2022-02-28 DIAGNOSIS — M62838 Other muscle spasm: Secondary | ICD-10-CM | POA: Diagnosis not present

## 2022-02-28 DIAGNOSIS — M542 Cervicalgia: Secondary | ICD-10-CM

## 2022-02-28 DIAGNOSIS — R1310 Dysphagia, unspecified: Secondary | ICD-10-CM | POA: Diagnosis not present

## 2022-02-28 MED ORDER — TIZANIDINE HCL 4 MG PO TABS: 4.0000 mg | ORAL_TABLET | Freq: Four times a day (QID) | ORAL | 1 refills | Status: DC | PRN

## 2022-02-28 NOTE — Patient Instructions (Addendum)
Thank you for coming in today.   Continue PT.   If this does not resolve with PT next step is likely speech therapy evaluation for swallow.   We could even do a gastroenterology evaluation.   I will eventually get a MRI of the Cspine if not improved.   I think give this a month.   Use the double bandaid splint.  Use modeling clay hand therapy.   Please get an Xray today before you leave
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
## 2022-03-04 ENCOUNTER — Ambulatory Visit: Payer: BC Managed Care – PPO | Admitting: Physical Therapy

## 2022-03-04 ENCOUNTER — Encounter: Payer: Self-pay | Admitting: Physical Therapy

## 2022-03-04 DIAGNOSIS — M542 Cervicalgia: Secondary | ICD-10-CM

## 2022-03-04 DIAGNOSIS — M62838 Other muscle spasm: Secondary | ICD-10-CM | POA: Diagnosis not present
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
## 2022-03-04 NOTE — Progress Notes (Signed)
Right middle finger x-ray shows some arthritis changes.  No fractures are visible.

## 2022-03-07 ENCOUNTER — Ambulatory Visit: Payer: BC Managed Care – PPO | Admitting: Physical Therapy

## 2022-03-07 ENCOUNTER — Encounter: Payer: Self-pay | Admitting: Physical Therapy

## 2022-03-07 DIAGNOSIS — M542 Cervicalgia: Secondary | ICD-10-CM

## 2022-03-07 DIAGNOSIS — M62838 Other muscle spasm: Secondary | ICD-10-CM | POA: Diagnosis not present
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
## 2022-03-11 ENCOUNTER — Ambulatory Visit: Payer: BC Managed Care – PPO | Admitting: Physical Therapy

## 2022-03-11 ENCOUNTER — Encounter: Payer: Self-pay | Admitting: Physical Therapy

## 2022-03-11 DIAGNOSIS — M542 Cervicalgia: Secondary | ICD-10-CM | POA: Diagnosis not present

## 2022-03-11 DIAGNOSIS — M62838 Other muscle spasm: Secondary | ICD-10-CM
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
## 2022-03-14 ENCOUNTER — Ambulatory Visit: Payer: BC Managed Care – PPO | Admitting: Physical Therapy

## 2022-03-14 ENCOUNTER — Encounter: Payer: Self-pay | Admitting: Physical Therapy

## 2022-03-14 DIAGNOSIS — M62838 Other muscle spasm: Secondary | ICD-10-CM

## 2022-03-14 DIAGNOSIS — M542 Cervicalgia: Secondary | ICD-10-CM | POA: Diagnosis not present
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
## 2022-03-18 ENCOUNTER — Ambulatory Visit: Payer: BC Managed Care – PPO | Admitting: Physical Therapy

## 2022-03-18 ENCOUNTER — Encounter: Payer: Self-pay | Admitting: Physical Therapy

## 2022-03-18 DIAGNOSIS — M62838 Other muscle spasm: Secondary | ICD-10-CM | POA: Diagnosis not present

## 2022-03-18 DIAGNOSIS — M542 Cervicalgia: Secondary | ICD-10-CM | POA: Diagnosis not present
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
## 2022-03-21 ENCOUNTER — Encounter: Payer: Self-pay | Admitting: Physical Therapy

## 2022-03-21 ENCOUNTER — Ambulatory Visit: Payer: BC Managed Care – PPO | Admitting: Physical Therapy

## 2022-03-21 DIAGNOSIS — M542 Cervicalgia: Secondary | ICD-10-CM

## 2022-03-21 DIAGNOSIS — M62838 Other muscle spasm: Secondary | ICD-10-CM | POA: Diagnosis not present
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
## 2022-03-26 ENCOUNTER — Encounter: Payer: Self-pay | Admitting: Physical Therapy

## 2022-03-28 ENCOUNTER — Ambulatory Visit: Payer: BC Managed Care – PPO | Admitting: Physical Therapy

## 2022-03-28 ENCOUNTER — Encounter: Payer: Self-pay | Admitting: Physical Therapy

## 2022-03-28 DIAGNOSIS — M62838 Other muscle spasm: Secondary | ICD-10-CM

## 2022-03-28 DIAGNOSIS — M542 Cervicalgia: Secondary | ICD-10-CM
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
## 2022-04-03 ENCOUNTER — Ambulatory Visit: Payer: BC Managed Care – PPO | Admitting: Physical Therapy

## 2022-04-03 ENCOUNTER — Encounter: Payer: Self-pay | Admitting: Physical Therapy

## 2022-04-03 DIAGNOSIS — M542 Cervicalgia: Secondary | ICD-10-CM

## 2022-04-03 DIAGNOSIS — M62838 Other muscle spasm: Secondary | ICD-10-CM
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
## 2022-04-03 NOTE — Addendum Note (Signed)
Addended by: Lyndee Hensen on: 04/03/2022 01:17 PM   Modules accepted: Orders

## 2022-04-04 ENCOUNTER — Encounter (HOSPITAL_COMMUNITY): Payer: Self-pay | Admitting: Psychiatry

## 2022-04-04 ENCOUNTER — Ambulatory Visit (HOSPITAL_BASED_OUTPATIENT_CLINIC_OR_DEPARTMENT_OTHER): Payer: BC Managed Care – PPO | Admitting: Psychiatry

## 2022-04-04 DIAGNOSIS — F411 Generalized anxiety disorder: Secondary | ICD-10-CM

## 2022-04-04 MED ORDER — PROPRANOLOL HCL 10 MG PO TABS: 10.0000 mg | ORAL_TABLET | Freq: Three times a day (TID) | ORAL | 1 refills | Status: DC | PRN

## 2022-04-04 MED ORDER — PROPRANOLOL HCL 10 MG PO TABS: 10.0000 mg | ORAL_TABLET | Freq: Three times a day (TID) | ORAL | 1 refills | Status: DC
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
## 2022-04-09 ENCOUNTER — Ambulatory Visit: Payer: BC Managed Care – PPO | Admitting: Physical Therapy

## 2022-04-09 ENCOUNTER — Encounter: Payer: Self-pay | Admitting: Physical Therapy

## 2022-04-09 DIAGNOSIS — M62838 Other muscle spasm: Secondary | ICD-10-CM | POA: Diagnosis not present

## 2022-04-09 DIAGNOSIS — M542 Cervicalgia: Secondary | ICD-10-CM
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
## 2022-04-12 ENCOUNTER — Ambulatory Visit: Payer: BC Managed Care – PPO | Admitting: Physical Therapy

## 2022-04-12 ENCOUNTER — Encounter: Payer: Self-pay | Admitting: Physical Therapy

## 2022-04-12 DIAGNOSIS — M542 Cervicalgia: Secondary | ICD-10-CM

## 2022-04-12 DIAGNOSIS — M62838 Other muscle spasm: Secondary | ICD-10-CM | POA: Diagnosis not present
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
## 2022-04-18 ENCOUNTER — Ambulatory Visit (INDEPENDENT_AMBULATORY_CARE_PROVIDER_SITE_OTHER): Payer: BC Managed Care – PPO | Admitting: Clinical

## 2022-04-18 ENCOUNTER — Ambulatory Visit (HOSPITAL_COMMUNITY): Payer: Self-pay | Admitting: Clinical

## 2022-04-18 ENCOUNTER — Encounter (HOSPITAL_COMMUNITY): Payer: Self-pay

## 2022-04-18 ENCOUNTER — Encounter (HOSPITAL_COMMUNITY): Payer: Self-pay | Admitting: Clinical

## 2022-04-18 DIAGNOSIS — F419 Anxiety disorder, unspecified: Secondary | ICD-10-CM
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
## 2022-04-25 ENCOUNTER — Encounter (HOSPITAL_COMMUNITY): Payer: Self-pay | Admitting: Clinical

## 2022-04-25 ENCOUNTER — Ambulatory Visit (INDEPENDENT_AMBULATORY_CARE_PROVIDER_SITE_OTHER): Payer: BC Managed Care – PPO | Admitting: Clinical

## 2022-04-25 DIAGNOSIS — F419 Anxiety disorder, unspecified: Secondary | ICD-10-CM
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
## 2022-04-26 ENCOUNTER — Ambulatory Visit: Payer: BC Managed Care – PPO | Admitting: Family Medicine

## 2022-04-26 VITALS — BP 138/80 | HR 82 | Ht 67.0 in | Wt 186.0 lb

## 2022-04-26 DIAGNOSIS — M79642 Pain in left hand: Secondary | ICD-10-CM | POA: Diagnosis not present

## 2022-04-26 DIAGNOSIS — M79641 Pain in right hand: Secondary | ICD-10-CM

## 2022-04-26 DIAGNOSIS — M65342 Trigger finger, left ring finger: Secondary | ICD-10-CM | POA: Diagnosis not present

## 2022-04-26 DIAGNOSIS — M542 Cervicalgia: Secondary | ICD-10-CM

## 2022-04-26 NOTE — Patient Instructions (Addendum)
Thank you for coming in today.   I've referred you to Occupational Therapy.  Let us know if you don't hear from them in one week.   Please use Voltaren gel (Generic Diclofenac Gel) up to 4x daily for pain as needed.  This is available over-the-counter as both the name brand Voltaren gel and the generic diclofenac gel.   Keep me updated.   Let me know if this is not working.   We can do an injection.
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
## 2022-05-02 ENCOUNTER — Encounter: Payer: Self-pay | Admitting: Rehabilitative and Restorative Service Providers"

## 2022-05-02 ENCOUNTER — Other Ambulatory Visit: Payer: Self-pay

## 2022-05-02 ENCOUNTER — Ambulatory Visit: Payer: BC Managed Care – PPO | Admitting: Rehabilitative and Restorative Service Providers"

## 2022-05-02 DIAGNOSIS — M25541 Pain in joints of right hand: Secondary | ICD-10-CM

## 2022-05-02 DIAGNOSIS — M25542 Pain in joints of left hand: Secondary | ICD-10-CM | POA: Diagnosis not present

## 2022-05-02 DIAGNOSIS — R278 Other lack of coordination: Secondary | ICD-10-CM

## 2022-05-02 DIAGNOSIS — M25641 Stiffness of right hand, not elsewhere classified: Secondary | ICD-10-CM

## 2022-05-02 DIAGNOSIS — M25642 Stiffness of left hand, not elsewhere classified: Secondary | ICD-10-CM | POA: Diagnosis not present

## 2022-05-06 ENCOUNTER — Encounter (HOSPITAL_COMMUNITY): Payer: Self-pay | Admitting: Psychiatry

## 2022-05-06 ENCOUNTER — Ambulatory Visit (HOSPITAL_BASED_OUTPATIENT_CLINIC_OR_DEPARTMENT_OTHER): Payer: BC Managed Care – PPO | Admitting: Psychiatry

## 2022-05-06 VITALS — BP 135/82 | HR 66 | Resp 18 | Ht 67.0 in | Wt 185.0 lb

## 2022-05-06 DIAGNOSIS — F411 Generalized anxiety disorder: Secondary | ICD-10-CM

## 2022-05-06 DIAGNOSIS — F419 Anxiety disorder, unspecified: Secondary | ICD-10-CM | POA: Diagnosis not present

## 2022-05-06 MED ORDER — PROPRANOLOL HCL 20 MG PO TABS: 20.0000 mg | ORAL_TABLET | Freq: Three times a day (TID) | ORAL | 1 refills | Status: DC | PRN
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
## 2022-05-08 ENCOUNTER — Ambulatory Visit (HOSPITAL_COMMUNITY): Payer: BC Managed Care – PPO | Admitting: Clinical
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
## 2022-05-14 ENCOUNTER — Ambulatory Visit: Payer: BC Managed Care – PPO | Admitting: Rehabilitative and Restorative Service Providers"

## 2022-05-14 ENCOUNTER — Encounter: Payer: Self-pay | Admitting: Rehabilitative and Restorative Service Providers"

## 2022-05-14 DIAGNOSIS — R278 Other lack of coordination: Secondary | ICD-10-CM

## 2022-05-14 DIAGNOSIS — M25641 Stiffness of right hand, not elsewhere classified: Secondary | ICD-10-CM | POA: Diagnosis not present

## 2022-05-14 DIAGNOSIS — M25542 Pain in joints of left hand: Secondary | ICD-10-CM

## 2022-05-14 DIAGNOSIS — M25541 Pain in joints of right hand: Secondary | ICD-10-CM

## 2022-05-14 DIAGNOSIS — M25642 Stiffness of left hand, not elsewhere classified: Secondary | ICD-10-CM
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
## 2022-05-21 ENCOUNTER — Encounter: Payer: BC Managed Care – PPO | Admitting: Rehabilitative and Restorative Service Providers"
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
## 2022-05-28 ENCOUNTER — Encounter: Payer: Self-pay | Admitting: Rehabilitative and Restorative Service Providers"

## 2022-05-28 ENCOUNTER — Ambulatory Visit: Payer: BC Managed Care – PPO | Admitting: Rehabilitative and Restorative Service Providers"

## 2022-05-28 DIAGNOSIS — M25641 Stiffness of right hand, not elsewhere classified: Secondary | ICD-10-CM

## 2022-05-28 DIAGNOSIS — M25642 Stiffness of left hand, not elsewhere classified: Secondary | ICD-10-CM

## 2022-05-28 DIAGNOSIS — M25542 Pain in joints of left hand: Secondary | ICD-10-CM | POA: Diagnosis not present

## 2022-05-28 DIAGNOSIS — R278 Other lack of coordination: Secondary | ICD-10-CM | POA: Diagnosis not present

## 2022-05-28 DIAGNOSIS — M25541 Pain in joints of right hand: Secondary | ICD-10-CM

## 2022-05-31 ENCOUNTER — Ambulatory Visit (INDEPENDENT_AMBULATORY_CARE_PROVIDER_SITE_OTHER): Payer: BC Managed Care – PPO | Admitting: Clinical

## 2022-05-31 ENCOUNTER — Encounter (HOSPITAL_COMMUNITY): Payer: Self-pay | Admitting: Clinical

## 2022-05-31 DIAGNOSIS — F430 Acute stress reaction: Secondary | ICD-10-CM | POA: Diagnosis not present
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
## 2022-06-06 ENCOUNTER — Encounter (HOSPITAL_COMMUNITY): Payer: Self-pay | Admitting: Psychiatry

## 2022-06-06 ENCOUNTER — Telehealth (HOSPITAL_BASED_OUTPATIENT_CLINIC_OR_DEPARTMENT_OTHER): Payer: BC Managed Care – PPO | Admitting: Psychiatry

## 2022-06-06 DIAGNOSIS — F430 Acute stress reaction: Secondary | ICD-10-CM | POA: Diagnosis not present

## 2022-06-06 MED ORDER — HYDROXYZINE HCL 10 MG PO TABS: 10.0000 mg | ORAL_TABLET | Freq: Three times a day (TID) | ORAL | 1 refills | Status: DC | PRN

## 2022-06-06 MED ORDER — FLUOXETINE HCL 10 MG PO CAPS: 10.0000 mg | ORAL_CAPSULE | Freq: Every day | ORAL | 2 refills | Status: DC
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
## 2022-06-10 ENCOUNTER — Ambulatory Visit (HOSPITAL_COMMUNITY): Payer: BC Managed Care – PPO | Admitting: Psychiatry
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
## 2022-06-11 ENCOUNTER — Ambulatory Visit: Payer: BC Managed Care – PPO | Admitting: Rehabilitative and Restorative Service Providers"

## 2022-06-11 DIAGNOSIS — M25642 Stiffness of left hand, not elsewhere classified: Secondary | ICD-10-CM | POA: Diagnosis not present

## 2022-06-11 DIAGNOSIS — M25542 Pain in joints of left hand: Secondary | ICD-10-CM | POA: Diagnosis not present

## 2022-06-11 DIAGNOSIS — R278 Other lack of coordination: Secondary | ICD-10-CM | POA: Diagnosis not present

## 2022-06-11 DIAGNOSIS — M25641 Stiffness of right hand, not elsewhere classified: Secondary | ICD-10-CM

## 2022-06-11 DIAGNOSIS — M62838 Other muscle spasm: Secondary | ICD-10-CM

## 2022-06-21 ENCOUNTER — Encounter: Payer: Self-pay | Admitting: Physician Assistant

## 2022-06-21 ENCOUNTER — Other Ambulatory Visit: Payer: Self-pay | Admitting: Physician Assistant

## 2022-06-21 MED ORDER — KETOCONAZOLE 2 % EX CREA: 1.0000 | TOPICAL_CREAM | Freq: Three times a day (TID) | CUTANEOUS | 0 refills | Status: DC

## 2022-06-21 NOTE — Telephone Encounter (Signed)
Please see pt msg and advise 
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
## 2022-06-25 ENCOUNTER — Encounter: Payer: Self-pay | Admitting: Rehabilitative and Restorative Service Providers"

## 2022-06-25 ENCOUNTER — Ambulatory Visit: Payer: BC Managed Care – PPO | Admitting: Rehabilitative and Restorative Service Providers"

## 2022-06-25 DIAGNOSIS — R278 Other lack of coordination: Secondary | ICD-10-CM

## 2022-06-25 DIAGNOSIS — M25642 Stiffness of left hand, not elsewhere classified: Secondary | ICD-10-CM

## 2022-06-25 DIAGNOSIS — M25641 Stiffness of right hand, not elsewhere classified: Secondary | ICD-10-CM

## 2022-06-25 DIAGNOSIS — M25542 Pain in joints of left hand: Secondary | ICD-10-CM

## 2022-06-25 DIAGNOSIS — M25541 Pain in joints of right hand: Secondary | ICD-10-CM

## 2022-06-29 ENCOUNTER — Other Ambulatory Visit (HOSPITAL_COMMUNITY): Payer: Self-pay | Admitting: Psychiatry

## 2022-06-29 DIAGNOSIS — F430 Acute stress reaction: Secondary | ICD-10-CM

## 2022-07-01 ENCOUNTER — Ambulatory Visit (HOSPITAL_COMMUNITY): Payer: BC Managed Care – PPO | Admitting: Psychiatry

## 2022-07-04 ENCOUNTER — Ambulatory Visit (INDEPENDENT_AMBULATORY_CARE_PROVIDER_SITE_OTHER): Payer: BC Managed Care – PPO | Admitting: Clinical

## 2022-07-04 DIAGNOSIS — F411 Generalized anxiety disorder: Secondary | ICD-10-CM | POA: Diagnosis not present
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
## 2022-07-05 ENCOUNTER — Encounter (HOSPITAL_COMMUNITY): Payer: Self-pay | Admitting: Clinical
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
## 2022-07-15 ENCOUNTER — Encounter: Payer: Self-pay | Admitting: *Deleted

## 2022-07-16 ENCOUNTER — Ambulatory Visit: Payer: BC Managed Care – PPO | Admitting: Rehabilitative and Restorative Service Providers"

## 2022-07-16 ENCOUNTER — Encounter: Payer: Self-pay | Admitting: Rehabilitative and Restorative Service Providers"

## 2022-07-16 DIAGNOSIS — M25542 Pain in joints of left hand: Secondary | ICD-10-CM

## 2022-07-16 DIAGNOSIS — M25642 Stiffness of left hand, not elsewhere classified: Secondary | ICD-10-CM

## 2022-07-16 DIAGNOSIS — M25541 Pain in joints of right hand: Secondary | ICD-10-CM

## 2022-07-16 DIAGNOSIS — M25641 Stiffness of right hand, not elsewhere classified: Secondary | ICD-10-CM | POA: Diagnosis not present

## 2022-07-16 DIAGNOSIS — R278 Other lack of coordination: Secondary | ICD-10-CM

## 2022-07-18 ENCOUNTER — Ambulatory Visit (INDEPENDENT_AMBULATORY_CARE_PROVIDER_SITE_OTHER): Payer: BC Managed Care – PPO | Admitting: Clinical

## 2022-07-18 ENCOUNTER — Encounter (HOSPITAL_COMMUNITY): Payer: Self-pay | Admitting: Clinical

## 2022-07-18 DIAGNOSIS — F419 Anxiety disorder, unspecified: Secondary | ICD-10-CM | POA: Diagnosis not present
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
## 2022-07-19 ENCOUNTER — Encounter (HOSPITAL_COMMUNITY): Payer: Self-pay

## 2022-07-19 ENCOUNTER — Encounter (HOSPITAL_COMMUNITY): Payer: BC Managed Care – PPO | Admitting: Psychiatry

## 2022-07-19 DIAGNOSIS — F419 Anxiety disorder, unspecified: Secondary | ICD-10-CM

## 2022-07-19 NOTE — Progress Notes (Signed)
This encounter was created in error - please disregard.

## 2022-08-01 ENCOUNTER — Ambulatory Visit (HOSPITAL_COMMUNITY): Payer: BC Managed Care – PPO | Admitting: Clinical
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
## 2022-08-06 ENCOUNTER — Ambulatory Visit: Payer: BC Managed Care – PPO | Admitting: Rehabilitative and Restorative Service Providers"

## 2022-08-06 ENCOUNTER — Encounter: Payer: Self-pay | Admitting: Rehabilitative and Restorative Service Providers"

## 2022-08-06 DIAGNOSIS — M25541 Pain in joints of right hand: Secondary | ICD-10-CM | POA: Diagnosis not present

## 2022-08-06 DIAGNOSIS — M25641 Stiffness of right hand, not elsewhere classified: Secondary | ICD-10-CM | POA: Diagnosis not present

## 2022-08-06 DIAGNOSIS — M25542 Pain in joints of left hand: Secondary | ICD-10-CM

## 2022-08-06 DIAGNOSIS — R278 Other lack of coordination: Secondary | ICD-10-CM | POA: Diagnosis not present

## 2022-08-16 ENCOUNTER — Encounter (HOSPITAL_COMMUNITY): Payer: Self-pay | Admitting: Clinical

## 2022-08-16 ENCOUNTER — Ambulatory Visit (INDEPENDENT_AMBULATORY_CARE_PROVIDER_SITE_OTHER): Payer: BC Managed Care – PPO | Admitting: Clinical

## 2022-08-16 DIAGNOSIS — F419 Anxiety disorder, unspecified: Secondary | ICD-10-CM | POA: Diagnosis not present
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
## 2022-09-06 ENCOUNTER — Encounter: Payer: Self-pay | Admitting: Family Medicine

## 2022-09-15 NOTE — Progress Notes (Unsigned)
Rubin Payor, PhD, LAT, ATC acting as a scribe for Clementeen Graham, MD.  Tim Manning is a 62 y.o. male who presents to [facility] today for bilat hand pain. Pt was last seen by Dr. Denyse Amass on 04/26/22 for trigger finger of the L 4th finger and completed a course of OT, 7 visits (d/c 5/7).  Today, pt reports 4th finger left hand , and R hand 3rd digit and it is not improving. Would like to discuss CSI left hand 4th digit   He plays guitar and church.  He is a right-handed Psychologist, clinical.  Dx imaging: 02/28/22 R 3rd finger XR             02/02/22 C-spine & head CT             11/05/19 L fingers MRI             10/05/19 L hand XR  Pertinent review of systems: No fevers or chills  Relevant historical information: History of prior trigger finger.   Exam:  BP 122/78   Pulse 78   Ht 5\' 7"  (1.702 m)   Wt 189 lb (85.7 kg)   SpO2 98%   BMI 29.60 kg/m  General: Well Developed, well nourished, and in no acute distress.   MSK: Left hand normal. Tender palpation fourth palmar MCP.  Decreased range of motion IP joint fourth digit.  Right hand normal motion.    Lab and Radiology Results  Procedure: Real-time Ultrasound Guided Injection of left fourth MCP A1 pulley tendon sheath (trigger finger injection) Device: Philips Affiniti 50G Images permanently stored and available for review in PACS Verbal informed consent obtained.  Discussed risks and benefits of procedure. Warned about infection, bleeding, hyperglycemia damage to structures among others. Patient expresses understanding and agreement Time-out conducted.   Noted no overlying erythema, induration, or other signs of local infection.   Skin prepped in a sterile fashion.   Local anesthesia: Topical Ethyl chloride.   With sterile technique and under real time ultrasound guidance: 40 mg of Kenalog and 1 mL of lidocaine injected into tendon sheath at fourth MCP A1 pulley. Fluid seen entering the  tendon sheath.   Completed without difficulty   Pain immediately resolved suggesting accurate placement of the medication.   Advised to call if fevers/chills, erythema, induration, drainage, or persistent bleeding.   Images permanently stored and available for review in the ultrasound unit.  Impression: Technically successful ultrasound guided injection.      Assessment and Plan: 62 y.o. male with left hand trigger finger fourth digit.  Not improving with occupational therapy.  Plan for injection today and double Band-Aid splint.  He can return as soon as he would like for right hand trigger finger which is much more mild.  Could inject that whenever he would like.  Recheck as needed.   PDMP not reviewed this encounter. Orders Placed This Encounter  Procedures   [place] LIMITED JOINT SPACE STRUCTURES UP LEFT(NO LINKED CHARGES)    Order Specific Question:   Reason for Exam (SYMPTOM  OR DIAGNOSIS REQUIRED)    Answer:   finger pain    Order Specific Question:   Preferred imaging location?    Answer:   [facility]   No orders of the defined types were placed in this encounter.    Discussed warning signs or symptoms. Please see discharge instructions. Patient expresses understanding.   The above documentation has been reviewed and  is accurate and complete Lynne Leader, M.D.

## 2022-09-16 ENCOUNTER — Other Ambulatory Visit: Payer: Self-pay

## 2022-09-16 ENCOUNTER — Ambulatory Visit: Payer: BC Managed Care – PPO | Admitting: Family Medicine

## 2022-09-16 VITALS — BP 122/78 | HR 78 | Ht 67.0 in | Wt 189.0 lb

## 2022-09-16 DIAGNOSIS — M65342 Trigger finger, left ring finger: Secondary | ICD-10-CM | POA: Diagnosis not present

## 2022-09-16 DIAGNOSIS — M79642 Pain in left hand: Secondary | ICD-10-CM | POA: Diagnosis not present

## 2022-09-16 NOTE — Patient Instructions (Signed)
Thank you for coming in today.   Call or go to the ER if you develop a large red swollen joint with extreme pain or oozing puss.  '  I can inject the other side whenever you want.   Use the double bandaid splint for a little while and take it easy for a little while.

## 2022-10-06 ENCOUNTER — Other Ambulatory Visit (HOSPITAL_COMMUNITY): Payer: Self-pay | Admitting: Psychiatry

## 2022-10-06 DIAGNOSIS — F430 Acute stress reaction: Secondary | ICD-10-CM

## 2022-10-30 ENCOUNTER — Encounter (INDEPENDENT_AMBULATORY_CARE_PROVIDER_SITE_OTHER): Payer: Self-pay

## 2022-10-30 ENCOUNTER — Encounter: Payer: BC Managed Care – PPO | Admitting: Physician Assistant

## 2022-11-28 ENCOUNTER — Encounter: Payer: Self-pay | Admitting: Physician Assistant

## 2022-11-28 ENCOUNTER — Ambulatory Visit (INDEPENDENT_AMBULATORY_CARE_PROVIDER_SITE_OTHER): Payer: BC Managed Care – PPO | Admitting: Physician Assistant

## 2022-11-28 VITALS — BP 124/70 | HR 72 | Temp 97.7°F | Ht 67.0 in | Wt 188.8 lb

## 2022-11-28 DIAGNOSIS — Z125 Encounter for screening for malignant neoplasm of prostate: Secondary | ICD-10-CM

## 2022-11-28 DIAGNOSIS — B070 Plantar wart: Secondary | ICD-10-CM | POA: Diagnosis not present

## 2022-11-28 DIAGNOSIS — Z23 Encounter for immunization: Secondary | ICD-10-CM

## 2022-11-28 DIAGNOSIS — Z0000 Encounter for general adult medical examination without abnormal findings: Secondary | ICD-10-CM

## 2022-11-28 DIAGNOSIS — Z136 Encounter for screening for cardiovascular disorders: Secondary | ICD-10-CM

## 2022-11-28 LAB — LIPID PANEL
Cholesterol: 176 mg/dL (ref 0–200)
HDL: 52.2 mg/dL (ref >39.00)
LDL Cholesterol: 106 mg/dL — ABNORMAL HIGH (ref 0–99)
NonHDL: 123.38
Total CHOL/HDL Ratio: 3
Triglycerides: 85 mg/dL (ref 0.0–149.0)
VLDL: 17 mg/dL (ref 0.0–40.0)

## 2022-11-28 LAB — CBC WITH DIFFERENTIAL/PLATELET
Basophils Absolute: 0 10*3/uL (ref 0.0–0.1)
Basophils Relative: 0.9 % (ref 0.0–3.0)
Eosinophils Absolute: 0.1 10*3/uL (ref 0.0–0.7)
Eosinophils Relative: 1 % (ref 0.0–5.0)
HCT: 47.2 % (ref 39.0–52.0)
Hemoglobin: 15.6 g/dL (ref 13.0–17.0)
Lymphocytes Relative: 28.5 % (ref 12.0–46.0)
Lymphs Abs: 1.4 10*3/uL (ref 0.7–4.0)
MCHC: 33 g/dL (ref 30.0–36.0)
MCV: 91.4 fl (ref 78.0–100.0)
Monocytes Absolute: 0.5 10*3/uL (ref 0.1–1.0)
Monocytes Relative: 10.6 % (ref 3.0–12.0)
Neutro Abs: 2.9 10*3/uL (ref 1.4–7.7)
Neutrophils Relative %: 59 % (ref 43.0–77.0)
Platelets: 226 10*3/uL (ref 150.0–400.0)
RBC: 5.16 Mil/uL (ref 4.22–5.81)
RDW: 13.8 % (ref 11.5–15.5)
WBC: 5 10*3/uL (ref 4.0–10.5)

## 2022-11-28 LAB — COMPREHENSIVE METABOLIC PANEL
ALT: 20 U/L (ref 0–53)
AST: 14 U/L (ref 0–37)
Albumin: 4.2 g/dL (ref 3.5–5.2)
Alkaline Phosphatase: 68 U/L (ref 39–117)
BUN: 16 mg/dL (ref 6–23)
CO2: 30 meq/L (ref 19–32)
Calcium: 9.4 mg/dL (ref 8.4–10.5)
Chloride: 102 meq/L (ref 96–112)
Creatinine, Ser: 0.87 mg/dL (ref 0.40–1.50)
GFR: 91.94 mL/min (ref >60.00)
Glucose, Bld: 104 mg/dL — ABNORMAL HIGH (ref 70–99)
Potassium: 4.2 meq/L (ref 3.5–5.1)
Sodium: 138 meq/L (ref 135–145)
Total Bilirubin: 0.6 mg/dL (ref 0.2–1.2)
Total Protein: 6.9 g/dL (ref 6.0–8.3)

## 2022-11-28 LAB — HEMOGLOBIN A1C: Hgb A1c MFr Bld: 5.6 % (ref 4.6–6.5)

## 2022-11-28 MED ORDER — IMIQUIMOD 5 % EX CREA: TOPICAL_CREAM | CUTANEOUS | 2 refills | Status: AC

## 2022-11-28 NOTE — Patient Instructions (Signed)
Good to see you today! Please let me know if your wart does not improve in the next few months.

## 2022-11-28 NOTE — Progress Notes (Signed)
Subjective:    Patient ID: Tim Manning, male    DOB: 22-Jan-1961, 62 y.o.   MRN: [id number]  Chief Complaint  Patient presents with   Annual Exam    Pt states he developed a little spot on the right foot near the toes (wart like) sore when he stands on it. No other concerns. Patient does want flu shot today     HPI Patient is in today for annual exam. No health changes in the interim.   Acute concerns: R foot ?wart  Health maintenance: Lifestyle/ exercise: Walks daily  Nutrition: Cooks at home with wife Mental health: Occasional anxiety  Sleep: No concerns  Substance use: None ETOH: Very rare Sexual activity: Monogamous with wife  Immunizations: UTD  Colonoscopy: Never had one Prostate symptoms: No concerns  Vision / hearing: UTD - wears glasses; tinnitus bilateral ears still present Coffee in the mornings    Strong family history of dementia; no signs or symptoms at this time per patient   Past Medical History:  Diagnosis Date   Anxiety Feb. 2023   GERD (gastroesophageal reflux disease)    Tinnitus of both ears    Trigger finger     History reviewed. No pertinent surgical history.  Family History  Problem Relation Age of Onset   Stroke Mother    Dementia Mother    Brain cancer Father        tumor near brainstem; not malignant   Dementia Father    Anal fissures Father    Dementia Maternal Grandmother    Cancer Paternal Grandmother     Social History   Tobacco Use   Smoking status: Never   Smokeless tobacco: Never  Substance Use Topics   Alcohol use: Yes    Alcohol/week: 2.0 standard drinks of alcohol    Types: 2 Cans of beer per week    Comment: Very occasional drinker, never more than one beer.   Drug use: Never     No Known Allergies  Review of Systems NEGATIVE UNLESS OTHERWISE INDICATED IN HPI      Objective:     BP 124/70 (BP Location: Left Arm, Patient Position: Sitting, Cuff Size: Normal)   Pulse 72   Temp 97.7 F (36.5  C) (Temporal)   Ht 5\' 7"  (1.702 m)   Wt 188 lb 12.8 oz (85.6 kg)   SpO2 97%   BMI 29.57 kg/m   Wt Readings from Last 3 Encounters:  11/28/22 188 lb 12.8 oz (85.6 kg)  09/16/22 189 lb (85.7 kg)  04/26/22 186 lb (84.4 kg)    BP Readings from Last 3 Encounters:  11/28/22 124/70  09/16/22 122/78  04/26/22 138/80     Physical Exam Vitals and nursing note reviewed.  Constitutional:      General: He is not in acute distress.    Appearance: Normal appearance. He is not toxic-appearing.  HENT:     Head: Normocephalic and atraumatic.     Right Ear: Tympanic membrane, ear canal and external ear normal.     Left Ear: Tympanic membrane, ear canal and external ear normal.     Nose: Nose normal.     Mouth/Throat:     Mouth: Mucous membranes are moist.     Pharynx: Oropharynx is clear.  Eyes:     Extraocular Movements: Extraocular movements intact.     Conjunctiva/sclera: Conjunctivae normal.     Pupils: Pupils are equal, round, and reactive to light.  Cardiovascular:     Rate and  Rhythm: Normal rate and regular rhythm.     Pulses: Normal pulses.     Heart sounds: Normal heart sounds.  Pulmonary:     Effort: Pulmonary effort is normal.     Breath sounds: Normal breath sounds.  Abdominal:     General: Abdomen is flat. Bowel sounds are normal.     Palpations: Abdomen is soft.     Tenderness: There is no abdominal tenderness.  Musculoskeletal:        General: Normal range of motion.     Cervical back: Normal range of motion and neck supple.  Skin:    General: Skin is warm and dry.     Findings: Lesion (R foot deep plantar wart present) present. No rash.  Neurological:     General: No focal deficit present.     Mental Status: He is alert and oriented to person, place, and time.  Psychiatric:        Mood and Affect: Mood normal.        Behavior: Behavior normal.        Assessment & Plan:   Problem List Items Addressed This Visit   None Visit Diagnoses     Encounter  for annual physical exam    -  Primary   Relevant Orders   Lipid panel   PSA   TSH   Comprehensive metabolic panel   CBC with Differential/Platelet   Hemoglobin A1c   Plantar wart, right foot       Relevant Medications   imiquimod (ALDARA) 5 % cream (Start on 11/29/2022)   Need for vaccination       Relevant Orders   Flu vaccine trivalent PF, 6mos and older(Flulaval,Afluria,Fluarix,Fluzone) (Completed)        Plan: Age-appropriate screening and counseling performed today. Will check labs and call with results. Preventive measures discussed and printed in AVS for patient.  -UTD on HM -Vision / dental UTD -Keep working on lifestyle  Patient Counseling: [x]   Nutrition: Stressed importance of moderation in sodium/caffeine intake, saturated fat and cholesterol, caloric balance, sufficient intake of fresh fruits, vegetables, and fiber.  [x]   Stressed the importance of regular exercise.   []   Substance Abuse: Discussed cessation/primary prevention of tobacco, alcohol, or other drug use; driving or other dangerous activities under the influence; availability of treatment for abuse.   [x]   Injury prevention: Discussed safety belts, safety helmets, smoke detector, smoking near bedding or upholstery.   []   Sexuality: Discussed sexually transmitted diseases, partner selection, use of condoms, avoidance of unintended pregnancy  and contraceptive alternatives.   [x]   Dental health: Discussed importance of regular tooth brushing, flossing, and dental visits.  [x]   Health maintenance and immunizations reviewed. Please refer to Health maintenance section.      Return in about 1 year (around 11/28/2023) for physical.   Annalisia Ingber M Destini Cambre, PA-C

## 2022-11-29 LAB — PSA: PSA: 0.67 ng/mL (ref 0.10–4.00)

## 2022-11-29 LAB — TSH: TSH: 2.97 u[IU]/mL (ref 0.35–5.50)

## 2022-12-03 ENCOUNTER — Encounter: Payer: Self-pay | Admitting: Physician Assistant

## 2022-12-03 NOTE — Telephone Encounter (Signed)
Please see pt msg response to labs and advise

## 2022-12-25 ENCOUNTER — Ambulatory Visit: Payer: BC Managed Care – PPO | Admitting: Internal Medicine

## 2022-12-25 ENCOUNTER — Encounter: Payer: Self-pay | Admitting: Internal Medicine

## 2022-12-25 VITALS — BP 130/70 | HR 74 | Temp 98.1°F | Ht 67.0 in | Wt 190.6 lb

## 2022-12-25 DIAGNOSIS — S39012A Strain of muscle, fascia and tendon of lower back, initial encounter: Secondary | ICD-10-CM

## 2022-12-25 MED ORDER — KETOROLAC TROMETHAMINE 60 MG/2ML IM SOLN
60.0000 mg | Freq: Once | INTRAMUSCULAR | Status: AC
  Administered 2022-12-25: 60 mg via INTRAMUSCULAR

## 2022-12-25 MED ORDER — METHOCARBAMOL 500 MG PO TABS: 500.0000 mg | ORAL_TABLET | Freq: Three times a day (TID) | ORAL | 0 refills | Status: AC | PRN
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
## 2022-12-25 NOTE — Patient Instructions (Signed)
Ibuprofen 800mg  every 8 hours as needed for back pain. Take with food.  Use heating pad  Stretch as tolerated

## 2023-01-11 IMAGING — DX DG CHEST 2V
2 series · 2 of 2 positions shown · non-contrast
Comparison: Chest radiograph 06/16/2018.

CLINICAL DATA: Intermittent chest pain.

EXAM:
CHEST - 2 VIEW

[chest pa]
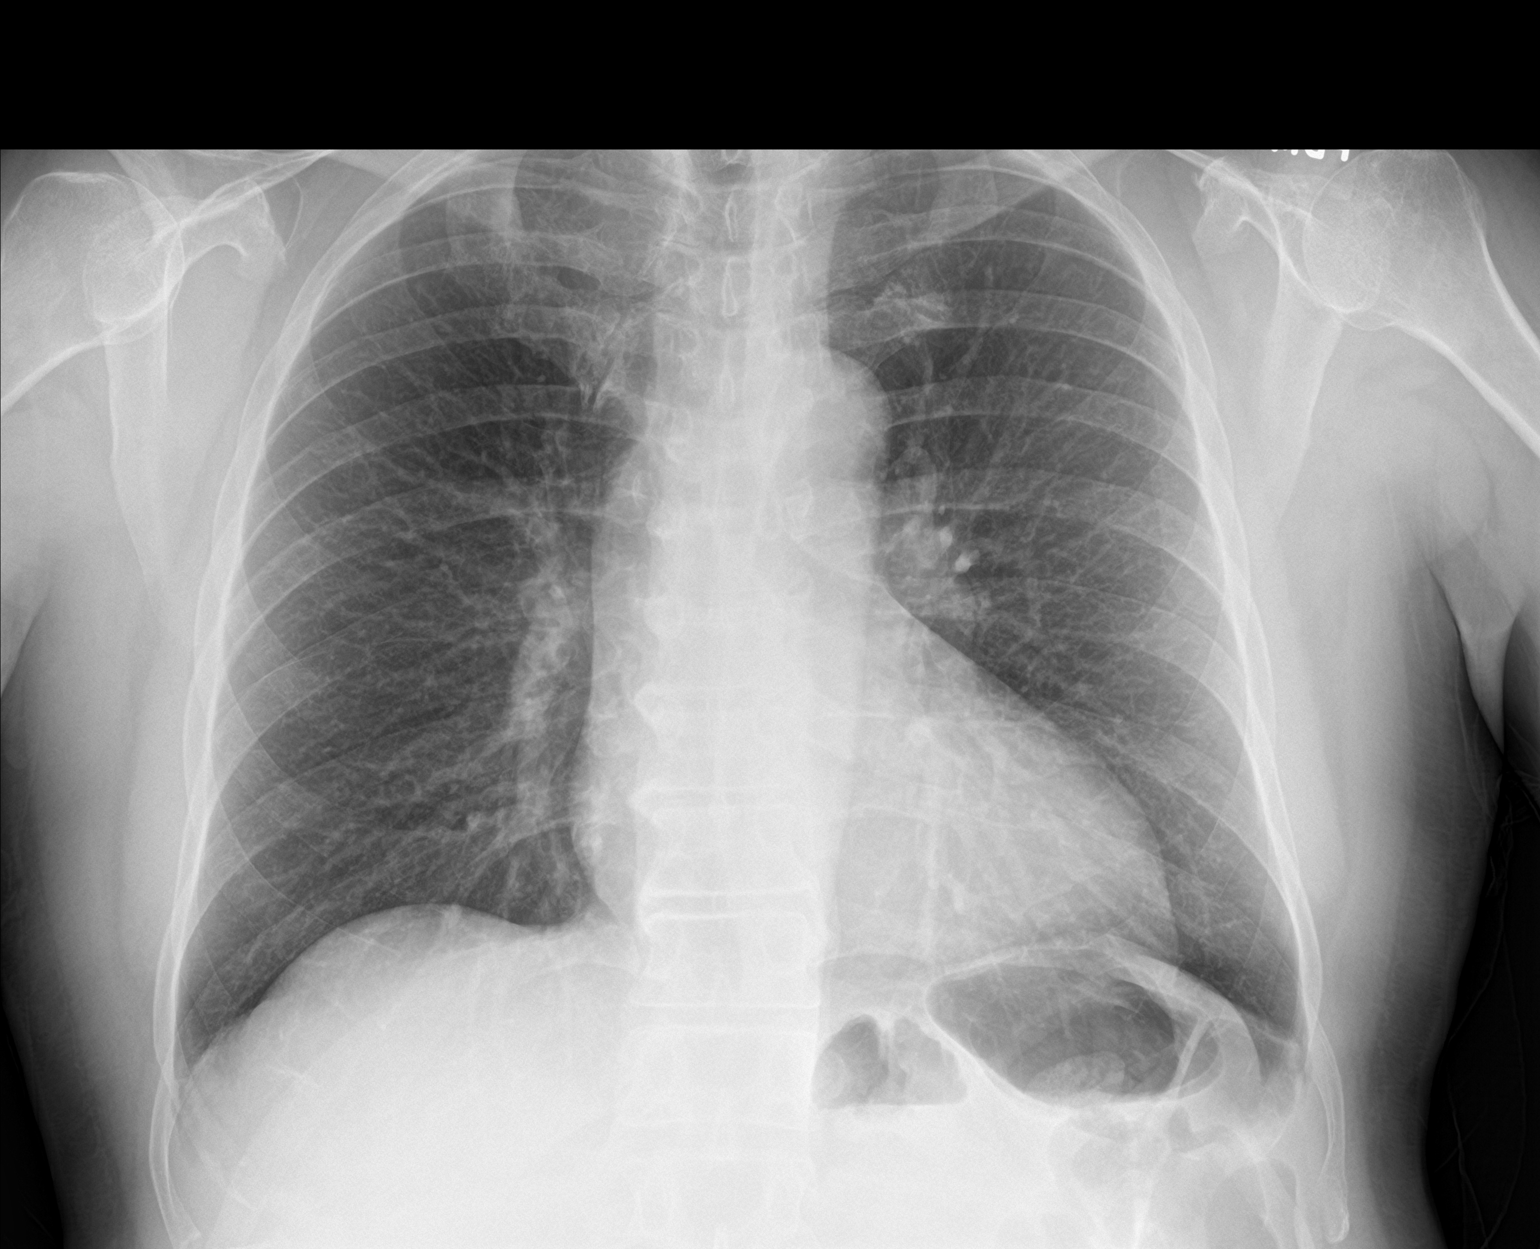

[chest lat]
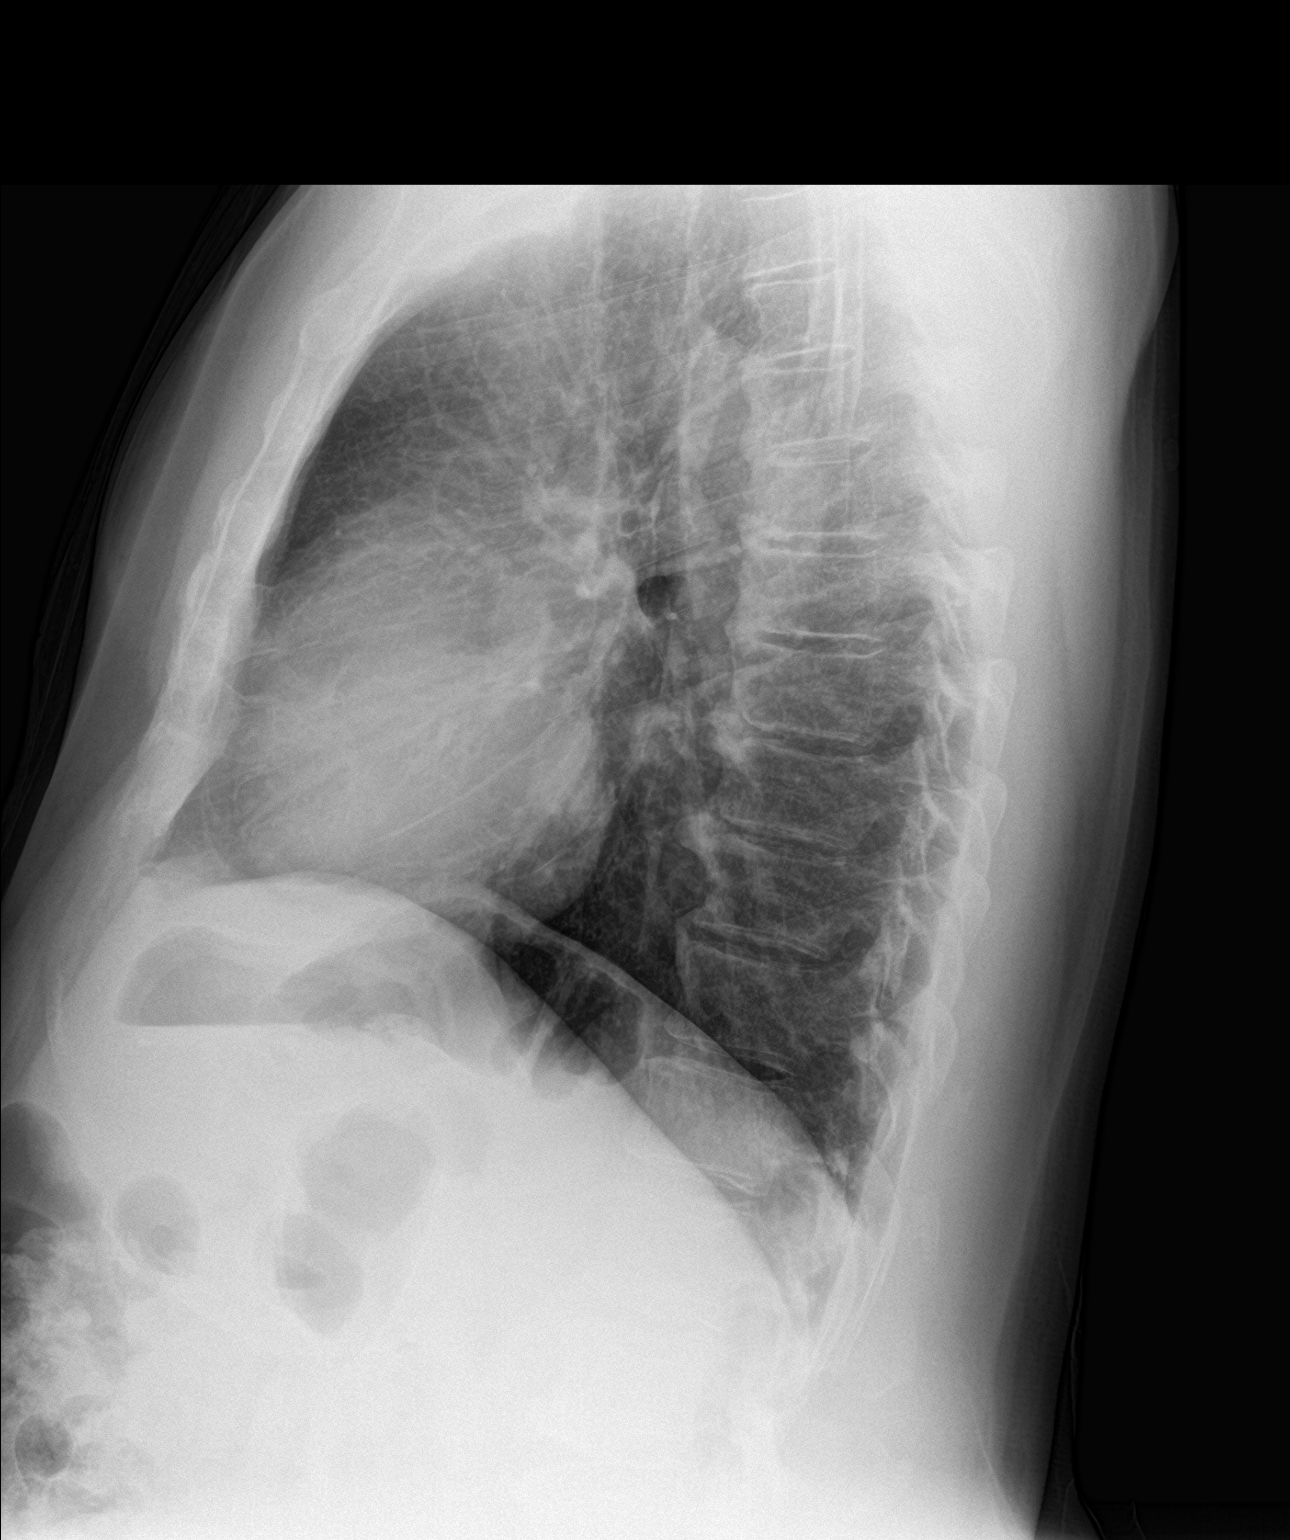

[2 of 2 positions shown; findings below may reference images not displayed]

FINDINGS: The heart size and mediastinal contours are within normal limits.
Both lungs are clear. The visualized skeletal structures are
unremarkable.
IMPRESSION: No active cardiopulmonary disease.

## 2024-01-12 ENCOUNTER — Telehealth: Admitting: Physician Assistant

## 2024-01-12 DIAGNOSIS — J208 Acute bronchitis due to other specified organisms: Secondary | ICD-10-CM | POA: Diagnosis not present

## 2024-01-12 MED ORDER — BENZONATATE 100 MG PO CAPS: 100.0000 mg | ORAL_CAPSULE | Freq: Three times a day (TID) | ORAL | 0 refills | Status: AC | PRN

## 2024-01-12 NOTE — Progress Notes (Signed)
 We are sorry that you are not feeling well.  Here is how we plan to help!  Based on your presentation I believe you most likely have A cough due to a virus.  This is called viral bronchitis and is best treated by rest, plenty of fluids and control of the cough.  You may use Ibuprofen  or Tylenol as directed to help your symptoms.     In addition you may use A prescription cough medication called Tessalon Perles 100mg . You may take 1-2 capsules every 8 hours as needed for your cough.  From your responses in the eVisit questionnaire you describe inflammation in the upper respiratory tract which is causing a significant cough.  This is commonly called Bronchitis and has four common causes:   Allergies Viral Infections Acid Reflux Bacterial Infection Allergies, viruses and acid reflux are treated by controlling symptoms or eliminating the cause. An example might be a cough caused by taking certain blood pressure medications. You stop the cough by changing the medication. Another example might be a cough caused by acid reflux. Controlling the reflux helps control the cough.  USE OF BRONCHODILATOR (RESCUE) INHALERS: There is a risk from using your bronchodilator too frequently.  The risk is that over-reliance on a medication which only relaxes the muscles surrounding the breathing tubes can reduce the effectiveness of medications prescribed to reduce swelling and congestion of the tubes themselves.  Although you feel brief relief from the bronchodilator inhaler, your asthma may actually be worsening with the tubes becoming more swollen and filled with mucus.  This can delay other crucial treatments, such as oral steroid medications. If you need to use a bronchodilator inhaler daily, several times per day, you should discuss this with your provider.  There are probably better treatments that could be used to keep your asthma under control.     HOME CARE Only take medications as instructed by your medical  team. Complete the entire course of an antibiotic. Drink plenty of fluids and get plenty of rest. Avoid close contacts especially the very young and the elderly Cover your mouth if you cough or cough into your sleeve. Always remember to wash your hands A steam or ultrasonic humidifier can help congestion.   GET HELP RIGHT AWAY IF: You develop worsening fever. You become short of breath You cough up blood. Your symptoms persist after you have completed your treatment plan MAKE SURE YOU  Understand these instructions. Will watch your condition. Will get help right away if you are not doing well or get worse.  Your e-visit answers were reviewed by a board certified advanced clinical practitioner to complete your personal care plan.  Depending on the condition, your plan could have included both over the counter or prescription medications. If there is a problem please reply  once you have received a response from your provider. Your safety is important to us .  If you have drug allergies check your prescription carefully.    You can use MyChart to ask questions about today's visit, request a non-urgent call back, or ask for a work or school excuse for 24 hours related to this e-Visit. If it has been greater than 24 hours you will need to follow up with your provider, or enter a new e-Visit to address those concerns. You will get an e-mail in the next two days asking about your experience.  I hope that your e-visit has been valuable and will speed your recovery. Thank you for using e-visits.  I have spent 5 minutes in review of e-visit questionnaire, review and updating patient chart, medical decision making and response to patient.   Elsie Velma Lunger, PA-C

## 2024-01-13 ENCOUNTER — Ambulatory Visit: Payer: Self-pay

## 2024-01-13 ENCOUNTER — Ambulatory Visit
Admission: EM | Admit: 2024-01-13 | Discharge: 2024-01-13 | Disposition: A | Discharge status: Home / Self Care | Attending: Family Medicine | Admitting: Family Medicine

## 2024-01-13 ENCOUNTER — Other Ambulatory Visit: Payer: Self-pay

## 2024-01-13 DIAGNOSIS — J069 Acute upper respiratory infection, unspecified: Secondary | ICD-10-CM

## 2024-01-13 MED ORDER — ALBUTEROL SULFATE HFA 108 (90 BASE) MCG/ACT IN AERS: 1.0000 | INHALATION_SPRAY | Freq: Four times a day (QID) | RESPIRATORY_TRACT | 0 refills | Status: AC | PRN

## 2024-01-13 MED ORDER — PROMETHAZINE-DM 6.25-15 MG/5ML PO SYRP: 5.0000 mL | ORAL_SOLUTION | Freq: Four times a day (QID) | ORAL | 0 refills | Status: AC | PRN

## 2024-01-13 MED ORDER — FLUTICASONE PROPIONATE 50 MCG/ACT NA SUSP: 1.0000 | Freq: Every day | NASAL | 0 refills | Status: AC

## 2024-01-13 NOTE — ED Triage Notes (Addendum)
 Pt c/o nasal drainage, productive cough w/clear musousx1wk. PT has been using saline rinses and it helps. PT has been having coughing fits and SOB w/coughingx3d. Pt unable to lay flat. Pt took home COVID test today and it was neg

## 2024-01-13 NOTE — Telephone Encounter (Signed)
 Noted

## 2024-01-13 NOTE — Discharge Instructions (Addendum)
 Start Flonase daily.  You may take Promethazine DM as needed for your cough.  Please note this medication will make you drowsy.  Do not drink alcohol or drive while on this medication.  You may use the albuterol inhaler as needed for your shortness of breath.  Lots of fluids and rest.  Please follow-up with your PCP in 2 to 3 days for recheck.  Please go to the ER for any worsening symptoms.  I hope you feel better soon!
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
## 2024-01-13 NOTE — Telephone Encounter (Signed)
 FYI Only or Action Required?: Action required by provider: update on patient condition.  Patient was last seen in primary care on 12/25/2022 by Billy Knee, FNP.  Called Nurse Triage reporting Cough.  Symptoms began several days ago.  Interventions attempted: OTC medications: Cough Syrups, Prescription medications: Tessalon Perles, and Rest, hydration, or home remedies.  Symptoms are: unchanged.  Triage Disposition: See Physician Within 24 Hours  Patient/caregiver understands and will follow disposition?: Yes   Copied from CRM #[id number]. Topic: Clinical - Red Word Triage >> Jan 13, 2024  2:09 PM Mesmerise C wrote: [facility] that prompted transfer to Nurse Triage: Patient's been having bad coughing spells, difficulty taking deep breaths, has a cold been ongoing Saturday, was prescribed Bezonatate but no improvement Reason for Disposition  [1] Continuous (nonstop) coughing interferes with work or school AND [2] no improvement using cough treatment per Care Advice  Answer Assessment - Initial Assessment Questions 1. ONSET: When did the cough begin?      Since Saturday  2. SEVERITY: How bad is the cough today?      Coughing Fits  3. SPUTUM: Describe the color of your sputum (e.g., none, dry cough; clear, white, yellow, green)     None  4. HEMOPTYSIS: Are you coughing up any blood? If Yes, ask: How much? (e.g., flecks, streaks, tablespoons, etc.)     None  5. DIFFICULTY BREATHING: Are you having difficulty breathing? If Yes, ask: How bad is it? (e.g., mild, moderate, severe)      With coughing  6. FEVER: Do you have a fever? If Yes, ask: What is your temperature, how was it measured, and when did it start?     No  7. CARDIAC HISTORY: Do you have any history of heart disease? (e.g., heart attack, congestive heart failure)      No  8. LUNG HISTORY: Do you have any history of lung disease?  (e.g., pulmonary embolus, asthma, emphysema)     No  9. PE RISK  FACTORS: Do you have a history of blood clots? (or: recent major surgery, recent prolonged travel, bedridden)     No  10. OTHER SYMPTOMS: Do you have any other symptoms? (e.g., runny nose, wheezing, chest pain)       Dyspnea with Coughing, Nasal Drainage, Headache   12. TRAVEL: Have you traveled out of the country in the last month? (e.g., travel history, exposures)       No  Protocols used: Cough - Acute Non-Productive-A-AH

## 2024-01-14 ENCOUNTER — Ambulatory Visit: Admitting: Family Medicine
